# Patient Record
Sex: Female | Born: 1975 | Race: White | Hispanic: No | Marital: Married | Smoking: Former smoker
Health system: Southern US, Community
[De-identification: ages and names within clinical notes are randomized; demographics above are authoritative.]

## PROBLEM LIST (undated history)

## (undated) DIAGNOSIS — K219 Gastro-esophageal reflux disease without esophagitis: Secondary | ICD-10-CM

## (undated) DIAGNOSIS — F32A Depression, unspecified: Secondary | ICD-10-CM

## (undated) DIAGNOSIS — G43909 Migraine, unspecified, not intractable, without status migrainosus: Secondary | ICD-10-CM

## (undated) DIAGNOSIS — E785 Hyperlipidemia, unspecified: Secondary | ICD-10-CM

## (undated) DIAGNOSIS — F329 Major depressive disorder, single episode, unspecified: Secondary | ICD-10-CM

## (undated) HISTORY — DX: Morbid (severe) obesity due to excess calories: E66.01

## (undated) HISTORY — PX: OTHER SURGICAL HISTORY: SHX169

## (undated) HISTORY — DX: Hyperlipidemia, unspecified: E78.5

## (undated) HISTORY — DX: Depression, unspecified: F32.A

## (undated) HISTORY — DX: Major depressive disorder, single episode, unspecified: F32.9

## (undated) HISTORY — DX: Migraine, unspecified, not intractable, without status migrainosus: G43.909

---

## 1998-09-27 HISTORY — PX: LAPAROSCOPIC CHOLECYSTECTOMY: SUR755

## 1999-01-22 ENCOUNTER — Ambulatory Visit (HOSPITAL_COMMUNITY): Admission: RE | Admit: 1999-01-22 | Discharge: 1999-01-23 | Payer: Self-pay | Admitting: General Surgery

## 2000-10-25 ENCOUNTER — Emergency Department (HOSPITAL_COMMUNITY): Admission: EM | Admit: 2000-10-25 | Discharge: 2000-10-25 | Payer: Self-pay | Admitting: Emergency Medicine

## 2001-02-14 ENCOUNTER — Other Ambulatory Visit: Admission: RE | Admit: 2001-02-14 | Discharge: 2001-02-14 | Payer: Self-pay | Admitting: Obstetrics and Gynecology

## 2001-08-17 ENCOUNTER — Encounter: Admission: RE | Admit: 2001-08-17 | Discharge: 2001-08-17 | Payer: Self-pay | Admitting: *Deleted

## 2001-08-17 ENCOUNTER — Encounter: Payer: Self-pay | Admitting: *Deleted

## 2002-01-02 ENCOUNTER — Inpatient Hospital Stay (HOSPITAL_COMMUNITY): Admission: AD | Admit: 2002-01-02 | Discharge: 2002-01-04 | Payer: Self-pay | Admitting: Obstetrics and Gynecology

## 2002-02-08 ENCOUNTER — Other Ambulatory Visit: Admission: RE | Admit: 2002-02-08 | Discharge: 2002-02-08 | Payer: Self-pay | Admitting: Obstetrics and Gynecology

## 2003-07-29 ENCOUNTER — Other Ambulatory Visit: Admission: RE | Admit: 2003-07-29 | Discharge: 2003-07-29 | Payer: Self-pay | Admitting: Obstetrics and Gynecology

## 2006-01-26 ENCOUNTER — Encounter: Payer: Self-pay | Admitting: General Surgery

## 2007-04-06 ENCOUNTER — Inpatient Hospital Stay (HOSPITAL_COMMUNITY): Admission: AD | Admit: 2007-04-06 | Discharge: 2007-04-08 | Payer: Self-pay | Admitting: Obstetrics and Gynecology

## 2008-07-28 HISTORY — PX: TUBAL LIGATION: SHX77

## 2008-08-19 ENCOUNTER — Ambulatory Visit (HOSPITAL_COMMUNITY): Admission: RE | Admit: 2008-08-19 | Discharge: 2008-08-19 | Payer: Self-pay | Admitting: Obstetrics and Gynecology

## 2009-11-06 ENCOUNTER — Emergency Department (HOSPITAL_COMMUNITY): Admission: EM | Admit: 2009-11-06 | Discharge: 2009-11-06 | Payer: Self-pay | Admitting: Emergency Medicine

## 2011-02-09 NOTE — Op Note (Signed)
NAMEGAO, MITNICK            ACCOUNT NO.:  [id number]   MEDICAL RECORD NO.:  [id number]          PATIENT TYPE:  AMB   LOCATION:  SDC                           FACILITY:  [facility]   PHYSICIAN:  Lenoard Aden, M.D.DATE OF BIRTH:  Apr 11, 1976   DATE OF PROCEDURE:  08/19/2008  DATE OF DISCHARGE:                               OPERATIVE REPORT   PREOPERATIVE DIAGNOSES:  Desire for elective sterilization and  intrauterine device removal.   POSTOPERATIVE DIAGNOSES:  Desire for elective sterilization and  intrauterine device removal.   PROCEDURE:  Mirena IUD removal, laparoscopic tubal ligation.   SURGEON:  Lenoard Aden, MD   ANESTHESIA:  General.   ESTIMATED BLOOD LOSS:  Less than 50 mL.   COMPLICATIONS:  None.   DRAINS AND COUNTS:  Correct.   The patient to recovery in good condition.   BRIEF OPERATIVE NOTE:  After being apprised of risks of anesthesia,  infection, bleeding, injury to abdominal organs, need for repair,  delayed versus immediate complications to include bowel and bladder  injury, the patient was brought to the operating room where she was  administered general anesthetic without complications, placed in dorsal  lithotomy position, feet on Yellofin stirrups, prepped and draped in  usual sterile fashion, catheterized until the bladder was empty.  Hulka  tenaculum placed per vagina after IUD was removed using a Kelly clamp.  The string was grasped, removed intact without difficulty.  Minimal  bleeding noted.  Hulka tenaculum placed per vagina.  Attention turned  abdominally whereby an infraumbilical incision made with scalpel.  Veress needle placed, opening pressure -2 noted, 4 L of CO2 insufflated  without difficulty.  Trocar placed atraumatically.  Visualization  reveals normal atraumatic trocar entry.  Normal liver gallbladder bed,  normal uterus, anterior posterior cul-de-sac, normal tubes, normal  ovaries.  Right tube was traced out to fimbriated end,  ampullary isthmic  portion of the tube was identified and cauterized with bipolar  coagulation in 3 contiguous segments.  Same procedure which was done on  the right tube was done on the left tube.  Both lumens were visualized  after division using hook scissors.  Good hemostasis noted.  CO2 was  released and revisualization with insufflation reveals good hemostasis.  All instruments  were removed under direct visualization.  CO2 was completely released.  Positive pressure applied.  Good hemostasis was noted.  Incision was  closed using 0-Vicryl and Dermabond.  All instruments removed from the  vagina.  The patient tolerated the procedure well, went to recovery in  good condition.      Lenoard Aden, M.D.  Electronically Signed     RJT/MEDQ  D:  08/19/2008  T:  08/20/2008  Job:  [id number]

## 2011-02-09 NOTE — Op Note (Signed)
Sheila Rodriguez, Sheila Rodriguez            ACCOUNT NO.:  [id number]   MEDICAL RECORD NO.:  [id number]          PATIENT TYPE:  INP   LOCATION:  9164                          FACILITY:  [facility]   PHYSICIAN:  [place] B. Earlene Plater, M.D.  DATE OF BIRTH:  16-Jun-1976   DATE OF PROCEDURE:  04/06/2007  DATE OF DISCHARGE:                               OPERATIVE REPORT   PREOPERATIVE DIAGNOSIS:  1. Term intrauterine pregnancy.  2. Fetal bradycardia.   POSTOPERATIVE DIAGNOSIS:  1. Term intrauterine pregnancy.  2. Fetal bradycardia.   PROCEDURE:  Vacuum assisted vaginal delivery.   INDICATIONS FOR PROCEDURE:  The patient was in labor and noted to have  an approximately 8 minute fetal bradycardia which intermittently  improved with position changes, oxygen, fluid bolus, discontinuation of  pitocin and Ephedrine, however, ultimately returned back to the 80s and  was as low as the 60s intermittently.  The patient was re-examined and  found to be complete after having been 8-9 cm only moments before.  She  was subsequently complete/complete and +2, direct OA position with fetal  heart rate rising back up into the normal range.  I recommended vacuum  delivery to expedite the process.   The bladder was being emptied with a Foley catheter which was removed  just prior to delivery.  Exam under anesthesia confirmed she was  complete/complete, +2, direct OA, with minimal molding or caput.  The  Mityvac mushroom cup was placed on the flexion point.  From the high  green zone, two pulls necessary, one pop-off encountered.  Double nuchal  cord noted, easily reduced.  Turtle sign encountered.  McRoberts  position assumed and suprapubic pressure applied without relief x1.  Subsequently, the posterior arm was palpated, did not feel amenable to  delivery.  Therefore, the shoulders were rotated of about 10 degrees of  the right shoulder to the patient's right.  This, in combination with  the above maneuvers, relieved the  dystocia.  This lasted about 20  seconds all together.  The remainder of the infant delivered without  difficulty.  Cord clamped and cut.  The infant handed off to the  awaiting NICU team.  The baby was immediately vigorous.  Cord pH  obtained.  Apgars were 9 and 9.  Cord pH was 7.24.   The perineum was inspected.  A second degree laceration noted, closed in  two layers with 3-0 chromic.  The placenta was removed with uterine  massage.  There was mild uterine atony, no retained products on exam.  Pitocin, uterine massage, and Hemabate x1 resolved the issue.  Blood  loss about 600 mL.  The patient and fetus tolerated the procedure well,  no complications.  They were both in the delivery room in good condition  thereafter.     Gerri Spore B. Earlene Plater, M.D.  Electronically Signed    WBD/MEDQ  D:  04/06/2007  T:  04/07/2007  Job:  [id number]

## 2011-02-09 NOTE — H&P (Signed)
NAMEJULIANNE, Sheila Rodriguez            ACCOUNT NO.:  [id number]   MEDICAL RECORD NO.:  [id number]          PATIENT TYPE:  AMB   LOCATION:  SDC                           [facility]ILITY:  [facility]   PHYSICIAN:  Lenoard Aden, M.D.DATE OF BIRTH:  07/21/1976   DATE OF ADMISSION:  08/19/2008  DATE OF DISCHARGE:                              HISTORY & PHYSICAL   CHIEF COMPLAINT:  Desire for elective sterilization.   A 35 year old white female, G2, P2 who presents for desire for elective  sterilization.  She is a nonsmoker, nondrinker.  She denies domestic or  physical violence.  She has a history of Mirena IUD use at this time  which was discontinued and we will proceed with permanent sterilization.  SHE HAS NO KNOWN DRUG ALLERGIES.   FAMILY HISTORY:  Breast cancer, migraine headaches, and diabetes.   SOCIAL HISTORY:  Noncontributory.   PREGNANCY HISTORY:  Two uncomplicated vaginal deliveries.   PHYSICAL EXAM:  She is a well-developed, well-nourished, white female in  no acute distress.  HEENT: Normal.  LUNGS: Clear.  HEART:  Regular rhythm.  ABDOMEN:  Soft, nontender.  PELVIC EXAM:  Uterus normal size, shape, and contour.  No adnexal  masses.   IMPRESSION:  Desire for elective sterilization.   PLAN:  Proceed with laparoscopic tubal ligation.  Risks of anesthesia,  infection, bleeding, injury to abdominal organs, need for repair was  discussed.  Delayed versus immediate complications to include bowel and  bladder injury, noted failure risk of tubal ligation 5 to 10 per 1000  noted.  Patient acknowledges and wishes to proceed.      Lenoard Aden, M.D.  Electronically Signed     RJT/MEDQ  D:  08/19/2008  T:  08/19/2008  Job:  [id number]

## 2011-06-29 LAB — CBC
HCT: 36.9
Hemoglobin: 12.5
MCHC: 34
MCV: 84.4
Platelets: 290
RBC: 4.37
RDW: 13.4
WBC: 9.4

## 2011-06-29 LAB — HCG, SERUM, QUALITATIVE: Preg, Serum: NEGATIVE

## 2011-07-13 LAB — CBC
HCT: 32.8 — ABNORMAL LOW
HCT: 37.8
Hemoglobin: 11.3 — ABNORMAL LOW
Hemoglobin: 12.9
MCHC: 34.2
MCHC: 34.6
MCV: 87.1
MCV: 87.5
Platelets: 254
RBC: 3.74 — ABNORMAL LOW
RBC: 4.35
RDW: 14.9 — ABNORMAL HIGH
RDW: 15.3 — ABNORMAL HIGH
WBC: 11.9 — ABNORMAL HIGH

## 2011-07-13 LAB — RAPID HIV SCREEN (WH-MAU): Rapid HIV Screen: NONREACTIVE

## 2012-06-30 ENCOUNTER — Ambulatory Visit (INDEPENDENT_AMBULATORY_CARE_PROVIDER_SITE_OTHER): Payer: BC Managed Care – PPO | Admitting: General Surgery

## 2012-07-19 ENCOUNTER — Encounter (INDEPENDENT_AMBULATORY_CARE_PROVIDER_SITE_OTHER): Payer: Self-pay | Admitting: General Surgery

## 2012-07-19 ENCOUNTER — Ambulatory Visit (INDEPENDENT_AMBULATORY_CARE_PROVIDER_SITE_OTHER): Payer: BC Managed Care – PPO | Admitting: General Surgery

## 2012-07-19 VITALS — BP 122/84 | HR 84 | Temp 98.0°F | Resp 16 | Ht 66.25 in | Wt 271.0 lb

## 2012-07-19 DIAGNOSIS — E66813 Obesity, class 3: Secondary | ICD-10-CM

## 2012-07-19 DIAGNOSIS — Z6841 Body Mass Index (BMI) 40.0 and over, adult: Secondary | ICD-10-CM

## 2012-07-19 NOTE — Progress Notes (Signed)
Patient ID: Sheila Rodriguez, female   DOB: 03/26/1976, 36 y.o.   MRN: [id number]  Chief Complaint  Patient presents with  . Weight Loss Surgery    lap band    HPI Sheila Rodriguez is a 36 y.o. female.   HPI 36 yo WF referred by Dr Billy Coast for consideration of weight loss surgery.  The patient was initially  Interested in laparoscopic adjustable gastric band surgery but is now leaning toward laparoscopic Roux-en-Y gastric bypass.  The patient states that she has struggled with her weight most of her adulthood. Despite numerous attempts for sustained weight loss, the patient has been unsuccessful. She has tried Weight Watchers at least on 3 separate occasions, curves, Meridia, Alli, protein shakes, and a 12 week boot camp all without any long-term success. She has also seen Noelle Redmon, PA-C, for supervised weight loss visits in the past.   Past Medical History  Diagnosis Date  . Depression   . Migraines     Past Surgical History  Procedure Date  . Laparoscopic cholecystectomy 2000  . Tubal ligation 07/2008    Family History  Problem Relation Age of Onset  . Breast cancer Mother 76  . Colon cancer Paternal Aunt   . Bladder Cancer Paternal Aunt   . Kidney cancer Maternal Uncle     Social History History  Substance Use Topics  . Smoking status: Former Smoker    Quit date: 12/27/2003  . Smokeless tobacco: Not on file  . Alcohol Use: Yes     once a month    No Known Allergies  Current Outpatient Prescriptions  Medication Sig Dispense Refill  . buPROPion (WELLBUTRIN XL) 300 MG 24 hr tablet       . FLUoxetine (PROZAC) 20 MG capsule       . ibuprofen (ADVIL,MOTRIN) 400 MG tablet Take 400 mg by mouth every 6 (six) hours as needed.      . ranitidine (ZANTAC) 75 MG tablet Take 75 mg by mouth 2 (two) times daily.        Review of Systems Review of Systems  Constitutional: Negative for fever, chills and unexpected weight change.  HENT: Negative for hearing loss,  congestion, sore throat, trouble swallowing and voice change.   Eyes: Negative for visual disturbance.  Respiratory: Negative for cough and wheezing.   Cardiovascular: Negative for chest pain, palpitations and leg swelling.  Gastrointestinal: Negative for nausea, vomiting, abdominal pain, blood in stool, abdominal distention and anal bleeding.       Some heartburn and occasional indigestion - resolves with zantac which takes prn; some occasional bouts of d/c but infrequent  Genitourinary: Negative for hematuria, vaginal bleeding and difficulty urinating.       Some urinary incontinence with coughing, sneezing; G3P3; has had a mammogram last year b/c of family history of cancer - normal. Getting mammo on 11/1  Musculoskeletal: Negative for arthralgias.  Skin: Negative for rash and wound.  Neurological: Negative for dizziness, seizures, syncope and headaches.  Hematological: Negative for adenopathy. Does not bruise/bleed easily.  Psychiatric/Behavioral: Negative for confusion.       Depression, sees [facility] - Dr Orion Modest (sp?)    Blood pressure 122/84, pulse 84, temperature 98 F (36.7 C), temperature source Temporal, resp. rate 16, height 5' 6.25" (1.683 m), weight 271 lb (122.925 kg).  Physical Exam Physical Exam  Vitals reviewed. Constitutional: She is oriented to person, place, and time. She appears well-developed and well-nourished. No distress.  HENT:  Head: Normocephalic and  atraumatic.  Right Ear: External ear normal.  Left Ear: External ear normal.  Nose: Nose normal.  Eyes: Conjunctivae normal and EOM are normal. No scleral icterus.  Neck: Normal range of motion. Neck supple. No tracheal deviation present. No thyromegaly present.  Cardiovascular: Normal rate, regular rhythm, normal heart sounds and intact distal pulses.   Pulmonary/Chest: Effort normal and breath sounds normal. No stridor. No respiratory distress. She has no wheezes. She has no rales.  Abdominal:  Soft. Bowel sounds are normal. She exhibits no distension. There is no tenderness. There is no rebound and no guarding.       Well healed trocar sites  Musculoskeletal: Normal range of motion. She exhibits no edema and no tenderness.  Lymphadenopathy:    She has no cervical adenopathy.  Neurological: She is alert and oriented to person, place, and time. She exhibits normal muscle tone.  Skin: Skin is warm and dry. No rash noted. She is not diaphoretic. No erythema. No pallor.  Psychiatric: She has a normal mood and affect. Her behavior is normal. Judgment and thought content normal.    Data Reviewed Letter of medical necessity Pt's personal letter  Assessment    Morbid obesity BMI 44 Elevated lipids Probable GERD    Plan    The patient meets weight loss surgery criteria. I think the patient would be an acceptable candidate for Laparoscopic Roux-en-Y Gastric bypass and LAGB. Currently, the patient is undecided as to which procedure but is leaning toward bypass.  We discussed laparoscopic Roux-en-Y gastric bypass. We discussed the preoperative, operative and postoperative process. Using diagrams, I explained the surgery in detail including the performance of an EGD near the end of the surgery and an Upper GI swallow study on POD 1. We discussed the typical hospital course including a 2-3 day stay baring any complications.   The patient was given educational material. I quoted the patient that they can expect to lose 50-70% of their excess weight with the gastric bypass. We did discuss the possibility of weight regain several years after the procedure.  We discussed the risk and benefits of surgery including but not limited to anesthesia risk, bleeding, infection, anastomotic edema requiring a few additional days in the hospital, postop nausea, blood clot formation, anastomotic leak, anastomotic stricture, ulcer formation, death, respiratory complications, intestinal blockage, internal  hernia, gallstone formation, vitamin and nutritional deficiencies, injury to surrounding structures, failure to lose weight and mood changes.  We discussed that before and after surgery that there would be an alteration in their diet. I explained that we have put them on a diet 2 weeks before surgery. I also explained that they would be on a liquid diet for 2 weeks after surgery. We discussed that they would have to avoid certain foods such as sugar after surgery. We discussed the importance of physical activity as well as compliance with our dietary and supplement recommendations and routine follow-up.  We discussed laparoscopic adjustable gastric banding. The patient was given Agricultural engineer. We discussed the risk and benefits of surgery including but not limited to bleeding, infection, injury to surrounding structures, blood clot formation such as deep venous thrombosis or pulmonary embolism, need to convert to an open procedure, band slippage, band erosion, failure to loose weight, port complications (leak or flippage), potential need for reoperative surgery, esophageal dilatation, worsening reflux, and vitamin deficiencies. We discussed the typical post operative recovery course. We discussed that their postoperative diet will be modified for several weeks. We specifically talked about the  need to be on a liquid diet for one to 2 weeks after surgery. We also discussed the typical postoperative course with a laparoscopic adjustable gastric band and the need for frequent postoperative visits to assess the volume status of the band.  We discussed the typical expected weight loss with a laparoscopic adjustable gastric band. I explained to the patient that they can expect to lose 40-60% of their excess body weight if they are compliant with their postoperative instructions. However I did explain that some patients loose less than 40% and some patients lose more than 60% of their excess body weight.  I  explained that the likelihood of improvement in their obesity is good.  I explained to the patient that we will start our evaluation process which includes labs, Upper GI to evaluate stomach and swallowing anatomy, nutritionist consultation, psychiatrist consultation, EKG, CXR.   She was instructed to call with any additional questions  Mary Sella. Andrey Campanile, MD, FACS General, Bariatric, & Minimally Invasive Surgery [facility] Surgery, [place]              [facility] M 07/19/2012, 12:23 PM

## 2012-07-19 NOTE — Patient Instructions (Signed)
Think about what we discussed. We will start the evaluation process. Please call me if you have any additional questions.

## 2012-07-26 ENCOUNTER — Ambulatory Visit (HOSPITAL_COMMUNITY)
Admission: RE | Admit: 2012-07-26 | Discharge: 2012-07-26 | Disposition: A | Discharge status: Home / Self Care | Payer: BC Managed Care – PPO | Source: Ambulatory Visit | Attending: General Surgery | Admitting: General Surgery

## 2012-07-26 ENCOUNTER — Other Ambulatory Visit: Payer: Self-pay

## 2012-07-26 DIAGNOSIS — Z6841 Body Mass Index (BMI) 40.0 and over, adult: Secondary | ICD-10-CM

## 2012-07-26 DIAGNOSIS — K219 Gastro-esophageal reflux disease without esophagitis: Secondary | ICD-10-CM | POA: Insufficient documentation

## 2012-07-26 DIAGNOSIS — E789 Disorder of lipoprotein metabolism, unspecified: Secondary | ICD-10-CM | POA: Insufficient documentation

## 2012-08-01 LAB — COMPREHENSIVE METABOLIC PANEL
ALT: 12 U/L (ref 0–35)
Alkaline Phosphatase: 71 U/L (ref 39–117)
CO2: 26 mEq/L (ref 19–32)
Creat: 0.77 mg/dL (ref 0.50–1.10)
Glucose, Bld: 72 mg/dL (ref 70–99)
Sodium: 140 mEq/L (ref 135–145)
Total Bilirubin: 0.4 mg/dL (ref 0.3–1.2)
Total Protein: 6.9 g/dL (ref 6.0–8.3)

## 2012-08-01 LAB — CBC WITH DIFFERENTIAL/PLATELET
Basophils Relative: 0 % (ref 0–1)
Eosinophils Absolute: 0.3 10*3/uL (ref 0.0–0.7)
Eosinophils Relative: 3 % (ref 0–5)
MCH: 26.4 pg (ref 26.0–34.0)
MCHC: 32.7 g/dL (ref 30.0–36.0)
MCV: 80.7 fL (ref 78.0–100.0)
Neutrophils Relative %: 60 % (ref 43–77)
Platelets: 342 10*3/uL (ref 150–400)

## 2012-08-01 LAB — TSH: TSH: 2.252 u[IU]/mL (ref 0.350–4.500)

## 2012-08-01 LAB — LIPID PANEL
Cholesterol: 166 mg/dL (ref 0–200)
HDL: 48 mg/dL (ref >39)
Total CHOL/HDL Ratio: 3.5 Ratio
VLDL: 36 mg/dL (ref 0–40)

## 2012-08-02 LAB — H. PYLORI ANTIBODY, IGG: H Pylori IgG: <0.4 {ISR}

## 2012-08-09 ENCOUNTER — Encounter: Payer: Self-pay | Admitting: *Deleted

## 2012-08-09 ENCOUNTER — Encounter: Payer: BC Managed Care – PPO | Attending: General Surgery | Admitting: *Deleted

## 2012-08-09 VITALS — Ht 66.25 in | Wt 269.9 lb

## 2012-08-09 DIAGNOSIS — Z01818 Encounter for other preprocedural examination: Secondary | ICD-10-CM | POA: Insufficient documentation

## 2012-08-09 DIAGNOSIS — Z713 Dietary counseling and surveillance: Secondary | ICD-10-CM | POA: Insufficient documentation

## 2012-08-09 NOTE — Patient Instructions (Signed)
   Follow Pre-Op Nutrition Goals to prepare for Lapband or Gastric Bypass Surgery.   Call the [facility] at [phone] once you have been given your surgery date to enrolled in the Pre-Op Nutrition Class. You will need to attend this nutrition class 3-4 weeks prior to your surgery.  

## 2012-08-09 NOTE — Progress Notes (Signed)
  Pre-Op Assessment Visit:  Pre-Operative LAGB Surgery  Medical Nutrition Therapy:  Appt start time: 0930   End time:  1030.  Patient was seen on 08/09/2012 for Pre-Operative Bariatric Surgery Nutrition Assessment. Assessment and letter of approval faxed to [facility] Bariatric Surgery Program coordinator on 08/09/2012.  Approval letter sent to [facility] center and will be available in the chart under the media tab.  TANITA  BODY COMP RESULTS  08/09/12   %Fat 53.6%   Fat Mass (lbs) 144.0   Fat Free Mass (lbs) 125.0   Total Body Water (lbs) 91.5   Handouts given during visit include:  Pre-Op Goals   Bariatric Surgery Protein Shakes  Patient to call for Pre-Op and Post-Op Nutrition Education at the [facility] when surgery is scheduled.

## 2012-09-06 ENCOUNTER — Other Ambulatory Visit (INDEPENDENT_AMBULATORY_CARE_PROVIDER_SITE_OTHER): Payer: Self-pay | Admitting: General Surgery

## 2012-09-28 ENCOUNTER — Encounter: Payer: BC Managed Care – PPO | Attending: General Surgery | Admitting: *Deleted

## 2012-09-28 VITALS — Ht 66.25 in | Wt 269.7 lb

## 2012-09-28 DIAGNOSIS — Z01818 Encounter for other preprocedural examination: Secondary | ICD-10-CM | POA: Insufficient documentation

## 2012-09-28 DIAGNOSIS — Z713 Dietary counseling and surveillance: Secondary | ICD-10-CM | POA: Insufficient documentation

## 2012-09-29 ENCOUNTER — Encounter: Payer: Self-pay | Admitting: *Deleted

## 2012-09-29 NOTE — Patient Instructions (Signed)
Follow:   Pre-Op Diet per MD 2 weeks prior to surgery  Phase 2- Liquids (clear/full) 2 weeks after surgery  Vitamin/Mineral/Calcium guidelines for purchasing bariatric supplements  Exercise guidelines pre and post-op per MD  Follow-up at [facility] in 2 weeks post-op for diet advancement. Contact Vasiliy Mccarry as needed with questions/concerns. 

## 2012-09-29 NOTE — Progress Notes (Signed)
Bariatric Class:  Appt start time: 1730 end time:  1830.  Pre-Operative Nutrition Class  Patient was seen on 09/28/12 for Pre-Operative Bariatric Surgery Education at the [facility].   Surgery date: 10/16/12 Surgery type: RYGB Start weight at [facility]: 269.9 lbs (08/09/12) Goal weight: 150 lbs  Weight today: 269.7 lbs  TANITA BODY COMP RESULTS   08/09/12    BMI (kg/m^2)   43.2   Fat Mass (lbs)  144.0    Fat Free Mass (lbs)  125.0    Total Body Water (lbs)  91.5    Samples given per MNT protocol: Bariatric Advantage Multivitamin Lot # [id number]; Exp: 06/15  Celebrate Vitamins Multivitamin Lot # [id number]; Exp: 07/14  Celebrate Vitamins Calcium Citrate Lot # [id number]; Exp: 03/15  Opurity Sublingual B12 Lot # [id number]; Exp: 09/15  Opurity Bypass/Sleeve-Optimized MVI Lot # [id number]; Exp: 06/14  Unjury Protein powder Lot # [id number]; Exp: 02/15  Premier Protein shake Lot # [id number]; Exp: 08/04/13  The following the learning objective met by the patient during this course:  Identifies Pre-Op Dietary Goals and will begin 2 weeks pre-operatively  Identifies appropriate sources of fluids and proteins   States protein recommendations and appropriate sources pre and post-operatively  Identifies Post-Operative Dietary Goals and will follow for 2 weeks post-operatively  Identifies appropriate multivitamin and calcium sources  Describes the need for physical activity post-operatively and will follow MD recommendations  States when to call healthcare provider regarding medication questions or post-operative complications  Handouts given during class include:  Pre-Op Bariatric Surgery Diet Handout  Protein Shake Handout  Post-Op Bariatric Surgery Nutrition Handout  BELT Program Information Flyer  Support Group Information Flyer  [facility] Bariatric Supplements Price List  Follow-Up Plan: Patient will follow-up at [facility] 2 weeks post operatively  for diet advancement per MD.

## 2012-10-05 ENCOUNTER — Encounter (HOSPITAL_COMMUNITY): Payer: Self-pay | Admitting: Pharmacy Technician

## 2012-10-11 ENCOUNTER — Encounter (HOSPITAL_COMMUNITY)
Admission: RE | Admit: 2012-10-11 | Discharge: 2012-10-11 | Disposition: A | Discharge status: Home / Self Care | Payer: BC Managed Care – PPO | Source: Ambulatory Visit | Attending: General Surgery | Admitting: General Surgery

## 2012-10-11 ENCOUNTER — Ambulatory Visit (INDEPENDENT_AMBULATORY_CARE_PROVIDER_SITE_OTHER): Payer: BC Managed Care – PPO | Admitting: General Surgery

## 2012-10-11 ENCOUNTER — Encounter (INDEPENDENT_AMBULATORY_CARE_PROVIDER_SITE_OTHER): Payer: Self-pay | Admitting: General Surgery

## 2012-10-11 ENCOUNTER — Encounter (HOSPITAL_COMMUNITY): Payer: Self-pay

## 2012-10-11 VITALS — BP 146/88 | HR 84 | Temp 97.7°F | Resp 20 | Ht 66.0 in | Wt 264.0 lb

## 2012-10-11 DIAGNOSIS — Z6841 Body Mass Index (BMI) 40.0 and over, adult: Secondary | ICD-10-CM

## 2012-10-11 HISTORY — DX: Gastro-esophageal reflux disease without esophagitis: K21.9

## 2012-10-11 LAB — CBC WITH DIFFERENTIAL/PLATELET
Eosinophils Absolute: 0.2 10*3/uL (ref 0.0–0.7)
Eosinophils Relative: 3 % (ref 0–5)
HCT: 40.8 % (ref 36.0–46.0)
Lymphocytes Relative: 28 % (ref 12–46)
Lymphs Abs: 2.2 10*3/uL (ref 0.7–4.0)
MCH: 27 pg (ref 26.0–34.0)
MCV: 82.9 fL (ref 78.0–100.0)
Monocytes Absolute: 0.6 10*3/uL (ref 0.1–1.0)
Monocytes Relative: 7 % (ref 3–12)
RBC: 4.92 MIL/uL (ref 3.87–5.11)
WBC: 7.7 10*3/uL (ref 4.0–10.5)

## 2012-10-11 LAB — COMPREHENSIVE METABOLIC PANEL
ALT: 25 U/L (ref 0–35)
BUN: 16 mg/dL (ref 6–23)
CO2: 29 mEq/L (ref 19–32)
Calcium: 9.5 mg/dL (ref 8.4–10.5)
Creatinine, Ser: 0.84 mg/dL (ref 0.50–1.10)
GFR calc Af Amer: >90 mL/min (ref >90)
GFR calc non Af Amer: 88 mL/min — ABNORMAL LOW (ref >90)
Glucose, Bld: 81 mg/dL (ref 70–99)
Sodium: 140 mEq/L (ref 135–145)
Total Protein: 7.4 g/dL (ref 6.0–8.3)

## 2012-10-11 MED ORDER — PEG-KCL-NACL-NASULF-NA ASC-C 100 G PO SOLR: 1.0000 | Freq: Once | ORAL | Status: DC

## 2012-10-11 NOTE — Progress Notes (Signed)
Patient ID: Sheila Rodriguez, female   DOB: June 01, 1976, 37 y.o.   MRN: [id number]  Chief Complaint  Patient presents with  . Bariatric Pre-op    HPI Sheila Rodriguez is a 37 y.o. female.   HPI 37 year old morbidly obese Caucasian female comes in today for her preoperative appointment. She is currently scheduled to undergo laparoscopic Roux-en-Y gastric bypass next week. She was initially seen in the office in October 2013. At that time she weighed 271 pounds. Today she weighs 264 pounds. She has already begun on her preoperative diet. She denies any significant changes to her medical history since she was last seen. She denies any surgeries or medical diagnoses. She denies any trips to the emergency room. She states that she has become a little bit constipated since starting the preoperative protein shakes. Past Medical History  Diagnosis Date  . Depression   . Migraines   . Morbid obesity   . Hyperlipidemia     TG - 178 (07/2012) per pt  . GERD (gastroesophageal reflux disease)     Past Surgical History  Procedure Date  . Laparoscopic cholecystectomy 2000  . Tubal ligation 07/2008    Family History  Problem Relation Age of Onset  . Breast cancer Mother 35  . Colon cancer Paternal Aunt   . Bladder Cancer Paternal Aunt   . Kidney cancer Maternal Uncle     Social History History  Substance Use Topics  . Smoking status: Former Smoker    Quit date: 12/27/2003  . Smokeless tobacco: Never Used  . Alcohol Use: Yes     Comment: once a month    No Known Allergies  Current Outpatient Prescriptions  Medication Sig Dispense Refill  . acetaminophen (TYLENOL) 500 MG tablet Take 500 mg by mouth every 6 (six) hours as needed.      Marland Kitchen buPROPion (WELLBUTRIN XL) 300 MG 24 hr tablet Take 300 mg by mouth every morning.       Marland Kitchen FLUoxetine (PROZAC) 20 MG capsule Take 20 mg by mouth daily.       Marland Kitchen ibuprofen (ADVIL,MOTRIN) 400 MG tablet Take 400 mg by mouth every 6 (six) hours as needed.  For pain      . ranitidine (ZANTAC) 75 MG tablet Take 75 mg by mouth 2 (two) times daily.      . peg 3350 powder (MOVIPREP) 100 G SOLR Take 1 kit (100 g total) by mouth once.  1 kit  0    Review of Systems Review of Systems  Constitutional: Negative for fever, chills and unexpected weight change.  HENT: Negative for hearing loss, congestion, sore throat, trouble swallowing and voice change.   Eyes: Negative for visual disturbance.  Respiratory: Negative for cough and wheezing.   Cardiovascular: Negative for chest pain, palpitations and leg swelling.  Gastrointestinal: Positive for constipation. Negative for nausea, vomiting, abdominal pain, diarrhea, blood in stool, abdominal distention and anal bleeding.  Genitourinary: Negative for hematuria, vaginal bleeding and difficulty urinating.  Musculoskeletal: Negative for arthralgias.  Skin: Negative for rash and wound.  Neurological: Negative for seizures, syncope and headaches.  Hematological: Negative for adenopathy. Does not bruise/bleed easily.  Psychiatric/Behavioral: Negative for confusion.    Blood pressure 146/88, pulse 84, temperature 97.7 F (36.5 C), temperature source Temporal, resp. rate 20, height 5\' 6"  (1.676 m), weight 264 lb (119.75 kg), last menstrual period 10/05/2012.  Physical Exam Physical Exam  Vitals reviewed. Constitutional: She is oriented to person, place, and time. She appears well-developed and well-nourished.  No distress.       Morbid obesity  HENT:  Head: Normocephalic and atraumatic.  Right Ear: External ear normal.  Left Ear: External ear normal.  Eyes: Conjunctivae normal are normal. No scleral icterus.  Neck: Normal range of motion. Neck supple. No tracheal deviation present. No thyromegaly present.  Cardiovascular: Normal rate, regular rhythm and normal heart sounds.   Pulmonary/Chest: Effort normal and breath sounds normal. No respiratory distress. She has no wheezes.  Abdominal: Soft. She  exhibits no distension. There is no tenderness. There is no rebound.       Well healed trocar sites  Musculoskeletal: She exhibits no edema and no tenderness.  Neurological: She is alert and oriented to person, place, and time. She exhibits normal muscle tone.  Skin: Skin is warm and dry. No rash noted. She is not diaphoretic. No erythema. No pallor.  Psychiatric: She has a normal mood and affect. Her behavior is normal. Judgment and thought content normal.    Data Reviewed My office note from 06/2012 ugi - reflux Wt loss evaluation labs - TG 178 o/w normal  Assessment    Morbid obesity BMI 42.6 GERD Elevated triglycerides    Plan    I congratulated her on her preoperative weight loss today. She has lost 7 pounds since I initially saw her. I encouraged her to continue with her preoperative diet. With respect to the constipation we talked about milk of magnesia or MiraLAX. He also discussed the importance of routine exercise. We discussed potentially getting a pedometer and tracking her steps. All of her questions were asked and answered. We reviewed the typical postoperative course. She was given a prescription for her bowel prep. I look forward to working with her.  Mary Sella. Andrey Campanile, MD, FACS General, Bariatric, & Minimally Invasive Surgery [facility], [place]        [facility] M 10/11/2012, 11:42 AM

## 2012-10-11 NOTE — Patient Instructions (Signed)
Get your bowel prep and let me know if there is a problem with getting it Keep up with the preop diet - you're doing great

## 2012-10-11 NOTE — Patient Instructions (Addendum)
Sheila Rodriguez  10/11/2012                           YOUR PROCEDURE IS SCHEDULED ON: 10/16/12               PLEASE REPORT TO SHORT STAY CENTER AT : 5:15 AM               CALL THIS NUMBER IF ANY PROBLEMS THE DAY OF SURGERY :               [phone]                      REMEMBER:   Do not eat food or drink liquids AFTER MIDNIGHT   Take these medicines the morning of surgery with A SIP OF WATER:  WELLBUTRIN / PROZAC / ZANTAC   Do not wear jewelry, make-up   Do not wear lotions, powders, or perfumes.   Do not shave legs or underarms 12 hrs. before surgery (men may shave face)  Do not bring valuables to the hospital.  Contacts, dentures or bridgework may not be worn into surgery.  Leave suitcase in the car. After surgery it may be brought to your room.  For patients admitted to the hospital more than one night, checkout time is 11:00                          The day of discharge.   Patients discharged the day of surgery will not be allowed to drive home                             If going home same day of surgery, must have someone stay with you first                           24 hrs at home and arrange for some one to drive you home from hospital.    Special Instructions:   Please read over the following fact sheets that you were given:               1. MRSA  INFORMATION                      2. [place] PREPARING FOR SURGERY SHEET               3. STOP HERBAL MEDICATIIONS AND ASPIRIN PRODUCTS AND IBUPROFEN / ADVIL / ALEVE  / MOTRIN 7 DAYS PREOP                                                X_____________________________________________________________________        Failure to follow these instructions may result in cancellation of your surgery

## 2012-10-16 ENCOUNTER — Inpatient Hospital Stay (HOSPITAL_COMMUNITY)
Admission: RE | Admit: 2012-10-16 | Discharge: 2012-10-18 | DRG: 288 | Disposition: A | Discharge status: Home / Self Care | Payer: BC Managed Care – PPO | Source: Ambulatory Visit | Attending: General Surgery | Admitting: General Surgery

## 2012-10-16 ENCOUNTER — Ambulatory Visit (HOSPITAL_COMMUNITY): Payer: BC Managed Care – PPO | Admitting: Anesthesiology

## 2012-10-16 ENCOUNTER — Encounter (HOSPITAL_COMMUNITY): Payer: Self-pay | Admitting: Anesthesiology

## 2012-10-16 ENCOUNTER — Encounter (HOSPITAL_COMMUNITY): Payer: Self-pay | Admitting: *Deleted

## 2012-10-16 ENCOUNTER — Encounter (HOSPITAL_COMMUNITY)
Admission: RE | Disposition: A | Discharge status: Home / Self Care | Payer: Self-pay | Source: Ambulatory Visit | Attending: General Surgery

## 2012-10-16 DIAGNOSIS — E785 Hyperlipidemia, unspecified: Secondary | ICD-10-CM | POA: Diagnosis present

## 2012-10-16 DIAGNOSIS — K5900 Constipation, unspecified: Secondary | ICD-10-CM | POA: Diagnosis present

## 2012-10-16 DIAGNOSIS — Z79899 Other long term (current) drug therapy: Secondary | ICD-10-CM

## 2012-10-16 DIAGNOSIS — Z6841 Body Mass Index (BMI) 40.0 and over, adult: Secondary | ICD-10-CM

## 2012-10-16 DIAGNOSIS — F3289 Other specified depressive episodes: Secondary | ICD-10-CM | POA: Diagnosis present

## 2012-10-16 DIAGNOSIS — Z87891 Personal history of nicotine dependence: Secondary | ICD-10-CM

## 2012-10-16 DIAGNOSIS — F329 Major depressive disorder, single episode, unspecified: Secondary | ICD-10-CM | POA: Diagnosis present

## 2012-10-16 DIAGNOSIS — K210 Gastro-esophageal reflux disease with esophagitis: Secondary | ICD-10-CM

## 2012-10-16 DIAGNOSIS — Z9089 Acquired absence of other organs: Secondary | ICD-10-CM

## 2012-10-16 DIAGNOSIS — E781 Pure hyperglyceridemia: Secondary | ICD-10-CM

## 2012-10-16 DIAGNOSIS — K219 Gastro-esophageal reflux disease without esophagitis: Secondary | ICD-10-CM | POA: Diagnosis present

## 2012-10-16 HISTORY — PX: UPPER GI ENDOSCOPY: SHX6162

## 2012-10-16 HISTORY — PX: GASTRIC ROUX-EN-Y: SHX5262

## 2012-10-16 LAB — HEMOGLOBIN AND HEMATOCRIT, BLOOD: HCT: 39.4 % (ref 36.0–46.0)

## 2012-10-16 SURGERY — LAPAROSCOPIC ROUX-EN-Y GASTRIC
Anesthesia: General | Site: Abdomen

## 2012-10-16 MED ORDER — KCL IN DEXTROSE-NACL 20-5-0.45 MEQ/L-%-% IV SOLN
INTRAVENOUS | Status: DC
  Administered 2012-10-16 – 2012-10-18 (×6): via INTRAVENOUS
  Filled 2012-10-16 (×8): qty 1000

## 2012-10-16 MED ORDER — HEPARIN SODIUM (PORCINE) 5000 UNIT/ML IJ SOLN
5000.0000 [IU] | INTRAMUSCULAR | Status: AC
  Administered 2012-10-16: 5000 [IU] via SUBCUTANEOUS
  Filled 2012-10-16: qty 1

## 2012-10-16 MED ORDER — ACETAMINOPHEN 10 MG/ML IV SOLN
INTRAVENOUS | Status: AC
  Filled 2012-10-16: qty 100

## 2012-10-16 MED ORDER — NEOSTIGMINE METHYLSULFATE 1 MG/ML IJ SOLN
INTRAMUSCULAR | Status: DC | PRN
  Administered 2012-10-16: 5 mg via INTRAVENOUS

## 2012-10-16 MED ORDER — ACETAMINOPHEN 10 MG/ML IV SOLN
INTRAVENOUS | Status: DC | PRN
  Administered 2012-10-16: 1000 mg via INTRAVENOUS

## 2012-10-16 MED ORDER — DEXTROSE 5 % IV SOLN
2.0000 g | INTRAVENOUS | Status: AC
  Administered 2012-10-16: 2 g via INTRAVENOUS

## 2012-10-16 MED ORDER — DEXAMETHASONE SODIUM PHOSPHATE 10 MG/ML IJ SOLN
INTRAMUSCULAR | Status: DC | PRN
  Administered 2012-10-16: 10 mg via INTRAVENOUS

## 2012-10-16 MED ORDER — TISSEEL VH 10 ML EX KIT
PACK | CUTANEOUS | Status: AC
  Filled 2012-10-16: qty 2

## 2012-10-16 MED ORDER — CEFOXITIN SODIUM-DEXTROSE 1-4 GM-% IV SOLR (PREMIX)
INTRAVENOUS | Status: AC
  Filled 2012-10-16: qty 100

## 2012-10-16 MED ORDER — TISSEEL VH 10 ML EX KIT
PACK | CUTANEOUS | Status: AC
  Filled 2012-10-16: qty 1

## 2012-10-16 MED ORDER — ONDANSETRON HCL 4 MG/2ML IJ SOLN
4.0000 mg | INTRAMUSCULAR | Status: DC | PRN
  Administered 2012-10-16 (×2): 4 mg via INTRAVENOUS
  Filled 2012-10-16 (×2): qty 2

## 2012-10-16 MED ORDER — HYDROMORPHONE HCL PF 1 MG/ML IJ SOLN
INTRAMUSCULAR | Status: AC
  Filled 2012-10-16: qty 1

## 2012-10-16 MED ORDER — UNJURY CHOCOLATE CLASSIC POWDER
2.0000 [oz_av] | Freq: Four times a day (QID) | ORAL | Status: DC
  Administered 2012-10-18 (×2): 2 [oz_av] via ORAL

## 2012-10-16 MED ORDER — SUCCINYLCHOLINE CHLORIDE 20 MG/ML IJ SOLN
INTRAMUSCULAR | Status: DC | PRN
  Administered 2012-10-16: 100 mg via INTRAVENOUS

## 2012-10-16 MED ORDER — MIDAZOLAM HCL 5 MG/5ML IJ SOLN
INTRAMUSCULAR | Status: DC | PRN
  Administered 2012-10-16: 2 mg via INTRAVENOUS

## 2012-10-16 MED ORDER — GLYCOPYRROLATE 0.2 MG/ML IJ SOLN
INTRAMUSCULAR | Status: DC | PRN
  Administered 2012-10-16: 0.6 mg via INTRAVENOUS

## 2012-10-16 MED ORDER — MORPHINE SULFATE 10 MG/ML IJ SOLN
2.0000 mg | INTRAMUSCULAR | Status: DC | PRN
  Administered 2012-10-16 – 2012-10-17 (×4): 4 mg via INTRAVENOUS
  Filled 2012-10-16 (×4): qty 1

## 2012-10-16 MED ORDER — UNJURY VANILLA POWDER: 2.0000 [oz_av] | Freq: Four times a day (QID) | ORAL | Status: DC

## 2012-10-16 MED ORDER — PROPOFOL 10 MG/ML IV BOLUS
INTRAVENOUS | Status: DC | PRN
  Administered 2012-10-16: 160 mg via INTRAVENOUS

## 2012-10-16 MED ORDER — TISSEEL VH 10 ML EX KIT
PACK | CUTANEOUS | Status: DC | PRN
  Administered 2012-10-16: 10 mL

## 2012-10-16 MED ORDER — ACETAMINOPHEN 160 MG/5ML PO SOLN
650.0000 mg | ORAL | Status: DC | PRN
  Filled 2012-10-16: qty 20.3

## 2012-10-16 MED ORDER — HYDROMORPHONE HCL PF 1 MG/ML IJ SOLN
0.2500 mg | INTRAMUSCULAR | Status: DC | PRN
  Administered 2012-10-16 (×4): 0.5 mg via INTRAVENOUS

## 2012-10-16 MED ORDER — ONDANSETRON HCL 4 MG/2ML IJ SOLN
INTRAMUSCULAR | Status: DC | PRN
  Administered 2012-10-16: 4 mg via INTRAVENOUS

## 2012-10-16 MED ORDER — PROMETHAZINE HCL 25 MG/ML IJ SOLN: 6.2500 mg | INTRAMUSCULAR | Status: DC | PRN

## 2012-10-16 MED ORDER — UNJURY CHICKEN SOUP POWDER: 2.0000 [oz_av] | Freq: Four times a day (QID) | ORAL | Status: DC

## 2012-10-16 MED ORDER — KCL IN DEXTROSE-NACL 20-5-0.45 MEQ/L-%-% IV SOLN
INTRAVENOUS | Status: AC
  Filled 2012-10-16: qty 1000

## 2012-10-16 MED ORDER — ROCURONIUM BROMIDE 100 MG/10ML IV SOLN
INTRAVENOUS | Status: DC | PRN
  Administered 2012-10-16 (×2): 10 mg via INTRAVENOUS
  Administered 2012-10-16: 40 mg via INTRAVENOUS
  Administered 2012-10-16 (×2): 10 mg via INTRAVENOUS
  Administered 2012-10-16: 5 mg via INTRAVENOUS

## 2012-10-16 MED ORDER — ACETAMINOPHEN 10 MG/ML IV SOLN
1000.0000 mg | Freq: Four times a day (QID) | INTRAVENOUS | Status: AC
  Administered 2012-10-16 – 2012-10-17 (×4): 1000 mg via INTRAVENOUS
  Filled 2012-10-16 (×6): qty 100

## 2012-10-16 MED ORDER — LACTATED RINGERS IV SOLN
INTRAVENOUS | Status: DC | PRN
  Administered 2012-10-16 (×3): via INTRAVENOUS

## 2012-10-16 MED ORDER — LACTATED RINGERS IR SOLN
Status: DC | PRN
  Administered 2012-10-16: 3000 mL

## 2012-10-16 MED ORDER — BUPIVACAINE-EPINEPHRINE 0.25% -1:200000 IJ SOLN
INTRAMUSCULAR | Status: AC
  Filled 2012-10-16: qty 1

## 2012-10-16 MED ORDER — OXYCODONE-ACETAMINOPHEN 5-325 MG/5ML PO SOLN
5.0000 mL | ORAL | Status: DC | PRN
  Administered 2012-10-18: 10 mL via ORAL
  Filled 2012-10-16: qty 10

## 2012-10-16 MED ORDER — FENTANYL CITRATE 0.05 MG/ML IJ SOLN
INTRAMUSCULAR | Status: DC | PRN
  Administered 2012-10-16 (×3): 50 ug via INTRAVENOUS

## 2012-10-16 MED ORDER — EPHEDRINE SULFATE 50 MG/ML IJ SOLN
INTRAMUSCULAR | Status: DC | PRN
  Administered 2012-10-16 (×2): 5 mg via INTRAVENOUS

## 2012-10-16 MED ORDER — LIDOCAINE HCL (CARDIAC) 20 MG/ML IV SOLN
INTRAVENOUS | Status: DC | PRN
  Administered 2012-10-16: 100 mg via INTRAVENOUS

## 2012-10-16 MED ORDER — ENOXAPARIN SODIUM 40 MG/0.4ML ~~LOC~~ SOLN
40.0000 mg | Freq: Two times a day (BID) | SUBCUTANEOUS | Status: DC
  Administered 2012-10-17 – 2012-10-18 (×3): 40 mg via SUBCUTANEOUS
  Filled 2012-10-16 (×5): qty 0.4

## 2012-10-16 MED ORDER — PANTOPRAZOLE SODIUM 40 MG IV SOLR
40.0000 mg | INTRAVENOUS | Status: DC
  Administered 2012-10-16 – 2012-10-17 (×2): 40 mg via INTRAVENOUS
  Filled 2012-10-16 (×3): qty 40

## 2012-10-16 MED ORDER — BUPIVACAINE-EPINEPHRINE 0.25% -1:200000 IJ SOLN
INTRAMUSCULAR | Status: DC | PRN
  Administered 2012-10-16: 50 mL

## 2012-10-16 SURGICAL SUPPLY — 79 items
APPLICATOR COTTON TIP 6IN STRL (MISCELLANEOUS) ×6 IMPLANT
APPLIER CLIP ROT 13.4 12 LRG (CLIP)
BENZOIN TINCTURE PRP APPL 2/3 (GAUZE/BANDAGES/DRESSINGS) ×3 IMPLANT
BLADE SURG 15 STRL LF DISP TIS (BLADE) IMPLANT
BLADE SURG 15 STRL SS (BLADE)
BLADE SURG SZ11 CARB STEEL (BLADE) ×3 IMPLANT
CABLE HIGH FREQUENCY MONO STRZ (ELECTRODE) ×3 IMPLANT
CANISTER SUCTION 2500CC (MISCELLANEOUS) ×3 IMPLANT
CLIP APPLIE ROT 13.4 12 LRG (CLIP) IMPLANT
CLIP SUT LAPRA TY ABSORB (SUTURE) ×6 IMPLANT
CLOTH BEACON ORANGE TIMEOUT ST (SAFETY) ×3 IMPLANT
COVER SURGICAL LIGHT HANDLE (MISCELLANEOUS) ×3 IMPLANT
CUTTER LINEAR ENDO ART 45 ETS (STAPLE) ×3 IMPLANT
DERMABOND ADVANCED (GAUZE/BANDAGES/DRESSINGS)
DERMABOND ADVANCED .7 DNX12 (GAUZE/BANDAGES/DRESSINGS) IMPLANT
DEVICE SUTURE ENDOST 10MM (ENDOMECHANICALS) ×3 IMPLANT
DISSECTOR BLUNT TIP ENDO 5MM (MISCELLANEOUS) IMPLANT
DRAIN PENROSE 18X1/4 LTX STRL (WOUND CARE) ×3 IMPLANT
DRAPE CAMERA CLOSED 9X96 (DRAPES) ×3 IMPLANT
DRAPE UTILITY XL STRL (DRAPES) ×3 IMPLANT
DRSG TEGADERM 2-3/8X2-3/4 SM (GAUZE/BANDAGES/DRESSINGS) ×15 IMPLANT
ELECT REM PT RETURN 9FT ADLT (ELECTROSURGICAL) ×3
ELECTRODE REM PT RTRN 9FT ADLT (ELECTROSURGICAL) ×2 IMPLANT
GAUZE SPONGE 4X4 16PLY XRAY LF (GAUZE/BANDAGES/DRESSINGS) ×3 IMPLANT
GLOVE BIO SURGEON STRL SZ7.5 (GLOVE) ×6 IMPLANT
GLOVE BIOGEL M STRL SZ7.5 (GLOVE) ×3 IMPLANT
GLOVE BIOGEL PI IND STRL 7.0 (GLOVE) ×4 IMPLANT
GLOVE BIOGEL PI INDICATOR 7.0 (GLOVE) ×2
GLOVE INDICATOR 8.0 STRL GRN (GLOVE) ×3 IMPLANT
GOWN STRL NON-REIN LRG LVL3 (GOWN DISPOSABLE) ×6 IMPLANT
GOWN STRL REIN XL XLG (GOWN DISPOSABLE) ×15 IMPLANT
HEMOSTAT SURGICEL 4X8 (HEMOSTASIS) IMPLANT
HOVERMATT SINGLE USE (MISCELLANEOUS) ×3 IMPLANT
KIT BASIN OR (CUSTOM PROCEDURE TRAY) ×3 IMPLANT
KIT GASTRIC LAVAGE 34FR ADT (SET/KITS/TRAYS/PACK) ×3 IMPLANT
MARKER SKIN DUAL TIP RULER LAB (MISCELLANEOUS) ×3 IMPLANT
NEEDLE SPNL 22GX3.5 QUINCKE BK (NEEDLE) ×3 IMPLANT
NS IRRIG 1000ML POUR BTL (IV SOLUTION) ×3 IMPLANT
PACK CARDIOVASCULAR III (CUSTOM PROCEDURE TRAY) ×3 IMPLANT
POUCH SPECIMEN RETRIEVAL 10MM (ENDOMECHANICALS) IMPLANT
RELOAD 45 VASCULAR/THIN (ENDOMECHANICALS) ×3 IMPLANT
RELOAD BLUE (STAPLE) ×6 IMPLANT
RELOAD ENDO STITCH 2.0 (ENDOMECHANICALS) ×10
RELOAD GOLD (STAPLE) ×3 IMPLANT
RELOAD STAPLE TA45 3.5 REG BLU (ENDOMECHANICALS) ×6 IMPLANT
RELOAD WHITE ECR60W (STAPLE) ×3 IMPLANT
SCALPEL HARMONIC ACE (MISCELLANEOUS) ×3 IMPLANT
SCISSORS LAP 5X35 DISP (ENDOMECHANICALS) ×3 IMPLANT
SEALANT SURGICAL APPL DUAL CAN (MISCELLANEOUS) ×3 IMPLANT
SET IRRIG TUBING LAPAROSCOPIC (IRRIGATION / IRRIGATOR) ×3 IMPLANT
SLEEVE ADV FIXATION 12X100MM (TROCAR) ×6 IMPLANT
SLEEVE ADV FIXATION 5X100MM (TROCAR) ×3 IMPLANT
SLEEVE ENDOPATH XCEL 5M (ENDOMECHANICALS) ×3 IMPLANT
SOLUTION ANTI FOG 6CC (MISCELLANEOUS) ×3 IMPLANT
SPONGE GAUZE 4X4 12PLY (GAUZE/BANDAGES/DRESSINGS) ×3 IMPLANT
STAPLE ECHEON FLEX 60 POW ENDO (STAPLE) ×3 IMPLANT
STAPLER VISISTAT 35W (STAPLE) ×3 IMPLANT
STRIP CLOSURE SKIN 1/2X4 (GAUZE/BANDAGES/DRESSINGS) ×3 IMPLANT
SUT ETHILON 3 0 PS 1 (SUTURE) IMPLANT
SUT MNCRL AB 4-0 PS2 18 (SUTURE) ×3 IMPLANT
SUT RELOAD ENDO STITCH 2 48X1 (ENDOMECHANICALS) ×12
SUT RELOAD ENDO STITCH 2.0 (ENDOMECHANICALS) ×8
SUT SILK 2 0 SH (SUTURE) ×3 IMPLANT
SUT VIC AB 2-0 SH 27 (SUTURE) ×1
SUT VIC AB 2-0 SH 27X BRD (SUTURE) ×2 IMPLANT
SUTURE RELOAD END STTCH 2 48X1 (ENDOMECHANICALS) ×12 IMPLANT
SUTURE RELOAD ENDO STITCH 2.0 (ENDOMECHANICALS) ×8 IMPLANT
SYR 20CC LL (SYRINGE) ×3 IMPLANT
SYR 50ML LL SCALE MARK (SYRINGE) ×3 IMPLANT
SYR CONTROL 10ML LL (SYRINGE) ×3 IMPLANT
TOWEL OR 17X26 10 PK STRL BLUE (TOWEL DISPOSABLE) ×3 IMPLANT
TRAY FOLEY CATH 14FRSI W/METER (CATHETERS) ×3 IMPLANT
TROCAR BALLN 12MMX100 BLUNT (TROCAR) ×3 IMPLANT
TROCAR BLADELESS OPT 5 100 (ENDOMECHANICALS) IMPLANT
TROCAR ENDOPATH XCEL 12X100 BL (ENDOMECHANICALS) ×9 IMPLANT
TROCAR XCEL 12X100 BLDLESS (ENDOMECHANICALS) IMPLANT
TUBING ENDO SMARTCAP (MISCELLANEOUS) ×3 IMPLANT
TUBING FILTER THERMOFLATOR (ELECTROSURGICAL) ×3 IMPLANT
WATER STERILE IRR 1500ML POUR (IV SOLUTION) ×3 IMPLANT

## 2012-10-16 NOTE — Anesthesia Preprocedure Evaluation (Signed)
Anesthesia Evaluation  Patient identified by MRN, date of birth, ID band Patient awake    Reviewed: Allergy & Precautions, H&P , NPO status , Patient's Chart, lab work & pertinent test results, reviewed documented beta blocker date and time   Airway Mallampati: II TM Distance: >3 FB Neck ROM: full    Dental No notable dental hx.    Pulmonary former smoker,  breath sounds clear to auscultation  Pulmonary exam normal       Cardiovascular Exercise Tolerance: Good negative cardio ROS  Rhythm:regular Rate:Normal     Neuro/Psych  Headaches, PSYCHIATRIC DISORDERS    GI/Hepatic negative GI ROS, Neg liver ROS, GERD-  Medicated,  Endo/Other  negative endocrine ROSMorbid obesity  Renal/GU negative Renal ROS  negative genitourinary   Musculoskeletal negative musculoskeletal ROS (+)   Abdominal   Peds negative pediatric ROS (+)  Hematology negative hematology ROS (+)   Anesthesia Other Findings   Reproductive/Obstetrics negative OB ROS                           Anesthesia Physical Anesthesia Plan  ASA: II  Anesthesia Plan: General ETT   Post-op Pain Management:    Induction:   Airway Management Planned:   Additional Equipment:   Intra-op Plan:   Post-operative Plan:   Informed Consent: I have reviewed the patients History and Physical, chart, labs and discussed the procedure including the risks, benefits and alternatives for the proposed anesthesia with the patient or authorized representative who has indicated his/her understanding and acceptance.   Dental Advisory Given  Plan Discussed with: CRNA  Anesthesia Plan Comments:         Anesthesia Quick Evaluation

## 2012-10-16 NOTE — Anesthesia Postprocedure Evaluation (Signed)
  Anesthesia Post-op Note  Patient: Sheila Rodriguez  Procedure(s) Performed: Procedure(s) (LRB): LAPAROSCOPIC ROUX-EN-Y GASTRIC (N/A) UPPER GI ENDOSCOPY ()  Patient Location: PACU  Anesthesia Type: General  Level of Consciousness: awake and alert   Airway and Oxygen Therapy: Patient Spontanous Breathing  Post-op Pain: mild  Post-op Assessment: Post-op Vital signs reviewed, Patient's Cardiovascular Status Stable, Respiratory Function Stable, Patent Airway and No signs of Nausea or vomiting  Last Vitals:  Filed Vitals:   10/16/12 1115  BP: 161/87  Pulse: 100  Temp: 36.8 C  Resp: 14    Post-op Vital Signs: stable   Complications: No apparent anesthesia complications

## 2012-10-16 NOTE — Op Note (Signed)
Name:  Sheila Rodriguez MRN: [id number] Date of Surgery: 10/16/2012  Preop Diagnosis:  Morbid Obesity, S/P RYGB  Postop Diagnosis:  Morbid Obesity, S/P RYGB  Procedure:  Upper endoscopy  (Intraoperative)  Surgeon:  Ovidio Kin, M.D.  Anesthesia:  GET  Indications for procedure: Sheila Rodriguez is a 37 y.o. female whose primary care physician is Lenoard Aden, MD and has completed a Roux-en-Y gastric bypass today by Dr. Andrey Campanile.  I am doing an intraoperative upper endoscopy to evaluate the gastric pouch and the gastro-jejunal anastomosis.  Operative Note: The patient is under general anesthesia.  Dr. Andrey Campanile is laparoscoping the patient while I do an upper endoscopy to evaluate the stomach pouch and gastrojejunal anastomosis.  With the patient intubated, I passed the Olympus endoscope without difficulty down the esophagus.  The esophago-gastric junction was at 38 cm.  The gastro-jejunal anastomosis was at 43 cm.  The mucosa of the stomach looked viable and the staple line was intact without bleeding.  The gastro-jejunal anastomosis looked okay.  While I insufflated the stomach pouch with air, Dr. Andrey Campanile clamped off the efferent limb of the jejunum.  He then flooded the upper abdomen with saline to put the gastric pouch and gastro-jejunal anastomosis under saline.  There was no bubbling or evidence of a leak.  Photos were taken of the anastomosis.  The scope was then withdrawn.  The esophagus was unremarkable and the patient tolerated the endoscopy without difficulty.  Ovidio Kin, MD, [facility] Surgery Pager: [phone] Office phone:  [phone]

## 2012-10-16 NOTE — H&P (View-Only) (Signed)
Patient ID: Sheila Rodriguez, female   DOB: 03/11/1976, 37 y.o.   MRN: [id number]  Chief Complaint  Patient presents with  . Bariatric Pre-op    HPI Sheila Rodriguez is a 37 y.o. female.   HPI 37-year-old morbidly obese Caucasian female comes in today for her preoperative appointment. She is currently scheduled to undergo laparoscopic Roux-en-Y gastric bypass next week. She was initially seen in the office in October 2013. At that time she weighed 271 pounds. Today she weighs 264 pounds. She has already begun on her preoperative diet. She denies any significant changes to her medical history since she was last seen. She denies any surgeries or medical diagnoses. She denies any trips to the emergency room. She states that she has become a little bit constipated since starting the preoperative protein shakes. Past Medical History  Diagnosis Date  . Depression   . Migraines   . Morbid obesity   . Hyperlipidemia     TG - 178 (07/2012) per pt  . GERD (gastroesophageal reflux disease)     Past Surgical History  Procedure Date  . Laparoscopic cholecystectomy 2000  . Tubal ligation 07/2008    Family History  Problem Relation Age of Onset  . Breast cancer Mother 41  . Colon cancer Paternal Aunt   . Bladder Cancer Paternal Aunt   . Kidney cancer Maternal Uncle     Social History History  Substance Use Topics  . Smoking status: Former Smoker    Quit date: 12/27/2003  . Smokeless tobacco: Never Used  . Alcohol Use: Yes     Comment: once a month    No Known Allergies  Current Outpatient Prescriptions  Medication Sig Dispense Refill  . acetaminophen (TYLENOL) 500 MG tablet Take 500 mg by mouth every 6 (six) hours as needed.      . buPROPion (WELLBUTRIN XL) 300 MG 24 hr tablet Take 300 mg by mouth every morning.       . FLUoxetine (PROZAC) 20 MG capsule Take 20 mg by mouth daily.       . ibuprofen (ADVIL,MOTRIN) 400 MG tablet Take 400 mg by mouth every 6 (six) hours as needed.  For pain      . ranitidine (ZANTAC) 75 MG tablet Take 75 mg by mouth 2 (two) times daily.      . peg 3350 powder (MOVIPREP) 100 G SOLR Take 1 kit (100 g total) by mouth once.  1 kit  0    Review of Systems Review of Systems  Constitutional: Negative for fever, chills and unexpected weight change.  HENT: Negative for hearing loss, congestion, sore throat, trouble swallowing and voice change.   Eyes: Negative for visual disturbance.  Respiratory: Negative for cough and wheezing.   Cardiovascular: Negative for chest pain, palpitations and leg swelling.  Gastrointestinal: Positive for constipation. Negative for nausea, vomiting, abdominal pain, diarrhea, blood in stool, abdominal distention and anal bleeding.  Genitourinary: Negative for hematuria, vaginal bleeding and difficulty urinating.  Musculoskeletal: Negative for arthralgias.  Skin: Negative for rash and wound.  Neurological: Negative for seizures, syncope and headaches.  Hematological: Negative for adenopathy. Does not bruise/bleed easily.  Psychiatric/Behavioral: Negative for confusion.    Blood pressure 146/88, pulse 84, temperature 97.7 F (36.5 C), temperature source Temporal, resp. rate 20, height 5' 6" (1.676 m), weight 264 lb (119.75 kg), last menstrual period 10/05/2012.  Physical Exam Physical Exam  Vitals reviewed. Constitutional: She is oriented to person, place, and time. She appears well-developed and well-nourished.   No distress.       Morbid obesity  HENT:  Head: Normocephalic and atraumatic.  Right Ear: External ear normal.  Left Ear: External ear normal.  Eyes: Conjunctivae normal are normal. No scleral icterus.  Neck: Normal range of motion. Neck supple. No tracheal deviation present. No thyromegaly present.  Cardiovascular: Normal rate, regular rhythm and normal heart sounds.   Pulmonary/Chest: Effort normal and breath sounds normal. No respiratory distress. She has no wheezes.  Abdominal: Soft. She  exhibits no distension. There is no tenderness. There is no rebound.       Well healed trocar sites  Musculoskeletal: She exhibits no edema and no tenderness.  Neurological: She is alert and oriented to person, place, and time. She exhibits normal muscle tone.  Skin: Skin is warm and dry. No rash noted. She is not diaphoretic. No erythema. No pallor.  Psychiatric: She has a normal mood and affect. Her behavior is normal. Judgment and thought content normal.    Data Reviewed My office note from 06/2012 ugi - reflux Wt loss evaluation labs - TG 178 o/w normal  Assessment    Morbid obesity BMI 42.6 GERD Elevated triglycerides    Plan    I congratulated her on her preoperative weight loss today. She has lost 7 pounds since I initially saw her. I encouraged her to continue with her preoperative diet. With respect to the constipation we talked about milk of magnesia or MiraLAX. He also discussed the importance of routine exercise. We discussed potentially getting a pedometer and tracking her steps. All of her questions were asked and answered. We reviewed the typical postoperative course. She was given a prescription for her bowel prep. I look forward to working with her.  Genecis Veley M. Kemyah Buser, MD, FACS General, Bariatric, & Minimally Invasive Surgery [facility] [place]        Natoshia Souter M 10/11/2012, 11:42 AM    

## 2012-10-16 NOTE — Op Note (Signed)
Preop diagnosis:  1. Morbid obesity (BMI 42.6)  2. GERD 3. Elevated triglycerides  Postop diagnosis:  1. same  Surgical procedure: Laparoscopic Roux-en-Y gastric bypass; upper endoscopy  Surgeon: Atilano Ina, M.D. FACS  Asst.: Ovidio Kin, MD FACS  Anesthesia: General plus 0.25% marcaine with epi  Complications: None   EBL: Minimal   Drains: None   Disposition: PACU in good condition   Indications for procedure: 37yo WF with morbid obesity who has been unsuccessful at sustained weight loss. The patient's comorbidities are listed above. We discussed the risk and benefits of surgery including but not limited to anesthesia risk, bleeding, infection, blood clot formation, anastomotic leak, anastomotic stricture, ulcer formation, death, respiratory complications, intestinal blockage, internal hernia, gallstone formation, vitamin and nutritional deficiencies, injury to surrounding structures, failure to lose weight and mood changes.   Description of procedure: Patient is brought to the operating room and general anesthesia induced. The patient had received preoperative broad-spectrum IV antibiotics and subcutaneous heparin. The abdomen was widely sterilely prepped with Chloraprep and draped. Patient timeout was performed and correct patient and procedure confirmed. Access was obtained with a 12 mm Optiview trocar in the left upper quadrant and pneumoperitoneum established without difficulty. Under direct vision 12 mm trocars were placed laterally in the right upper quadrant, right upper quadrant midclavicular line, and to the left and above the umbilicus for the camera port. A 5 mm trocar was placed laterally in the left upper quadrant.  The omentum was brought into the upper abdomen and the transverse mesocolon elevated and the ligament of Treitz clearly identified. A 40 cm biliopancreatic limb was then carefully measured from the ligament of Treitz. The small intestine was divided at this  point with a single firing of the white load linear stapler. A Penrose drain was sutured to the end of the Roux-en-Y limb for later identification. A 100 cm Roux-en-Y limb was then carefully measured. At this point a side-to-side anastomosis was created between the Roux limb and the end of the biliopancreatic limb. This was accomplished with a single firing of the 45 mm white load linear stapler. The common enterotomy was closed with a running 2-0 Vicryl begun at either end of the enterotomy and tied centrally.  I ended up placing a additional 2-0 vicryl u stitch where there was a small gap in the closure. The mesenteric defect was then closed with running 2-0 silk. Tisseel sealant was then placed over the anatomosis. The omentum was then divided with the harmonic scalpel up towards the transverse colon to allow mobility of the Roux limb toward the gastric pouch. The patient was then placed in steep reversed Trendelenburg. Through a 5 mm subxiphoid site the [facility] retractor was placed and the left lobe of the liver elevated with excellent exposure of the upper stomach and hiatus. The angle of Hiss was then mobilized with the harmonic scalpel. A 4 cm gastric pouch was then carefully measured along the lesser curve of the stomach. Dissection was carried along the lesser curve at this point with the Harmonic scalpel working carefully back toward the lesser sac at right angles to the lesser curve. The free lesser sac was then entered. After being sure all tubes were removed from the stomach an initial firing of the gold load 60 mm linear stapler was fired at right angles across the lesser curve for about 4 cm. The gastric pouch was further mobilized posteriorly and then the pouch was completed with 2 further firings of the 60 mm  blue load linear stapler and 1 final firing of a blue load 45mm linear stapler up through the previously dissected angle of His. It was ensured that the pouch was completely mobilized away  from the gastric remnant. This created a nice tubular 4-5 cm gastric pouch. The Roux limb was then brought up in an antecolic fashion with the candycane facing to the patient's left without undue tension. The gastrojejunostomy was created with an initial posterior row of 2-0 Vicryl between the Roux limb and the staple line of the gastric pouch. Enterotomies were then made in the gastric pouch and the Roux limb with the harmonic scalpel and at approximately 2-2-1/2 cm anastomosis was created with a single firing of the 45mm blue load linear stapler. The staple line was inspected and was intact without bleeding. The common enterotomy was then closed with running 2-0 Vicryl begun at either end and tied centrally. The Ewall tube was then easily passed through the anastomosis and an outer anterior layer of running 2-0 Vicryl was placed. The Ewald tube was removed. With the outlet of the gastrojejunostomy clamped and under saline irrigation the assistant performed upper endoscopy and with the gastric pouch tensely distended with air-there was no evidence of leak on this test. The pouch was desufflated. The Vonita Moss defect was closed with running 2-0 silk. The abdomen was inspected for any evidence of bleeding or bowel injury and everything looked fine. The Nathanson retractor was removed under direct vision after coating the anastomosis with Tisseel tissue sealant. All CO2 was evacuated and trochars removed. Skin incisions were closed with 4-0 monocryl in a subcuticular fashion followed by benzoin, steri-strips and occlusive dressings. Sponge needle and instrument counts were correct. The patient was taken to the PACU in good condition.    Mary Sella. Andrey Campanile, MD, FACS General, Bariatric, & Minimally Invasive Surgery [facility], [place]

## 2012-10-16 NOTE — Transfer of Care (Signed)
Immediate Anesthesia Transfer of Care Note  Patient: Sheila Rodriguez  Procedure(s) Performed: Procedure(s) (LRB) with comments: LAPAROSCOPIC ROUX-EN-Y GASTRIC (N/A) UPPER GI ENDOSCOPY ()  Patient Location: PACU  Anesthesia Type:General  Level of Consciousness: sedated  Airway & Oxygen Therapy: Patient Spontanous Breathing and Patient connected to face mask oxygen  Post-op Assessment: Report given to PACU RN and Post -op Vital signs reviewed and stable  Post vital signs: Reviewed and stable  Complications: No apparent anesthesia complications

## 2012-10-16 NOTE — Interval H&P Note (Signed)
History and Physical Interval Note:  10/16/2012 7:15 AM  Sheila Rodriguez  has presented today for surgery, with the diagnosis of morbid obesity   The various methods of treatment have been discussed with the patient and family. After consideration of risks, benefits and other options for treatment, the patient has consented to  Procedure(s) (LRB) with comments: LAPAROSCOPIC ROUX-EN-Y GASTRIC (N/A) UPPER GI ENDOSCOPY () as a surgical intervention .  The patient's history has been reviewed, patient examined, no change in status, stable for surgery.  I have reviewed the patient's chart and labs.  Questions were answered to the patient's satisfaction.    Mary Sella. Andrey Campanile, MD, FACS General, Bariatric, & Minimally Invasive Surgery [facility], [place]  [facility] M

## 2012-10-17 ENCOUNTER — Encounter (HOSPITAL_COMMUNITY): Payer: Self-pay | Admitting: General Surgery

## 2012-10-17 ENCOUNTER — Inpatient Hospital Stay (HOSPITAL_COMMUNITY): Payer: BC Managed Care – PPO

## 2012-10-17 LAB — CBC WITH DIFFERENTIAL/PLATELET
Eosinophils Relative: 0 % (ref 0–5)
HCT: 36.8 % (ref 36.0–46.0)
Hemoglobin: 12.1 g/dL (ref 12.0–15.0)
Lymphocytes Relative: 20 % (ref 12–46)
Lymphs Abs: 2.4 10*3/uL (ref 0.7–4.0)
MCV: 83.4 fL (ref 78.0–100.0)
Monocytes Absolute: 1 10*3/uL (ref 0.1–1.0)
Monocytes Relative: 8 % (ref 3–12)
Neutro Abs: 8.8 10*3/uL — ABNORMAL HIGH (ref 1.7–7.7)
WBC: 12.2 10*3/uL — ABNORMAL HIGH (ref 4.0–10.5)

## 2012-10-17 MED ORDER — IOHEXOL 300 MG/ML  SOLN: 50.0000 mL | Freq: Once | INTRAMUSCULAR | Status: AC | PRN

## 2012-10-17 NOTE — Care Management Note (Signed)
    Page 1 of 1   10/17/2012     10:58:16 AM   CARE MANAGEMENT NOTE 10/17/2012  Patient:  Sheila Rodriguez, Sheila Rodriguez   Account Number:  [id number]  Date Initiated:  10/17/2012  Documentation initiated by:  Lorenda Ishihara  Subjective/Objective Assessment:   37 yo female admitted s/p Laparoscopic Roux-en-Y gastric bypass. PTA lived at home with spouse.     Action/Plan:   Home when stable   Anticipated DC Date:  10/19/2012   Anticipated DC Plan:  HOME/SELF CARE      DC Planning Services  CM consult      Choice offered to / List presented to:             Status of service:  Completed, signed off Medicare Important Message given?   (If response is "NO", the following Medicare IM given date fields will be blank) Date Medicare IM given:   Date Additional Medicare IM given:    Discharge Disposition:  HOME/SELF CARE  Per UR Regulation:  Reviewed for med. necessity/level of care/duration of stay  If discussed at Long Length of Stay Meetings, dates discussed:    Comments:

## 2012-10-17 NOTE — Progress Notes (Signed)
Patient is alert and oriented denies nausea, vomiting, burping, or bowel movement.  Patient with minimal abdominal discomfort, relief with prn medications.  Patient is ambulating in hallways, and using incentive spirometer.  Patient remains npo, UGI results pending.  Discharge instructions given to patient for review, will go over in depth prior to discharge.    Quenton Fetter, Rn

## 2012-10-17 NOTE — Progress Notes (Signed)
1 Day Post-Op  Subjective: Had an ok night. No n/reflux. Pain ok. Walked in halls last pm  Objective: Vital signs in last 24 hours: Temp:  [97.7 F (36.5 C)-98.6 F (37 C)] 98.6 F (37 C) (01/21 0620) Pulse Rate:  [88-114] 88  (01/21 0620) Resp:  [8-20] 18  (01/21 0620) BP: (118-168)/(68-95) 118/73 mmHg (01/21 0620) SpO2:  [93 %-100 %] 99 % (01/21 0620) Weight:  [263 lb 4 oz (119.409 kg)] 263 lb 4 oz (119.409 kg) (01/20 1200) Last BM Date: 10/15/12  Intake/Output from previous day: 01/20 0701 - 01/21 0700 In: 5662.5 [I.V.:5362.5; IV Piggyback:300] Out: 1400 [Urine:1400] Intake/Output this shift:    Alert, nad cta with dec BS at bases Reg Soft, obese, +BS; mild approp TTP +SCDs  Lab Results:   Basename 10/17/12 0410 10/16/12 1348  WBC 12.2* --  HGB 12.1 12.8  HCT 36.8 39.4  PLT 288 --   BMET No results found for this basename: NA:2,K:2,CL:2,CO2:2,GLUCOSE:2,BUN:2,CREATININE:2,CALCIUM:2 in the last 72 hours PT/INR No results found for this basename: LABPROT:2,INR:2 in the last 72 hours ABG No results found for this basename: PHART:2,PCO2:2,PO2:2,HCO3:2 in the last 72 hours  Studies/Results: No results found.  Anti-infectives: Anti-infectives     Start     Dose/Rate Route Frequency Ordered Stop   10/16/12 0540   cefOXitin (MEFOXIN) 2 g in dextrose 5 % 50 mL IVPB        2 g 100 mL/hr over 30 Minutes Intravenous On call to O.R. 10/16/12 0540 10/16/12 0724          Assessment/Plan: s/p Procedure(s) (LRB) with comments: LAPAROSCOPIC ROUX-EN-Y GASTRIC (N/A) UPPER GI ENDOSCOPY ()  Doing well - afebrile, no tachy Hgb stable - cont VTE prophylaxis Awaiting UGI this am - if ok  Will start water/ice chips IS, ambulate  Mary Sella. Andrey Campanile, MD, FACS General, Bariatric, & Minimally Invasive Surgery [facility], [place]   LOS: 1 day    Atilano Ina 10/17/2012

## 2012-10-18 DIAGNOSIS — K219 Gastro-esophageal reflux disease without esophagitis: Secondary | ICD-10-CM | POA: Diagnosis present

## 2012-10-18 LAB — CBC WITH DIFFERENTIAL/PLATELET
Eosinophils Relative: 1 % (ref 0–5)
HCT: 36.2 % (ref 36.0–46.0)
Hemoglobin: 11.6 g/dL — ABNORMAL LOW (ref 12.0–15.0)
Lymphocytes Relative: 24 % (ref 12–46)
Lymphs Abs: 2.1 10*3/uL (ref 0.7–4.0)
MCV: 84.4 fL (ref 78.0–100.0)
Monocytes Absolute: 0.7 10*3/uL (ref 0.1–1.0)
Monocytes Relative: 8 % (ref 3–12)
Platelets: 275 10*3/uL (ref 150–400)
RBC: 4.29 MIL/uL (ref 3.87–5.11)
WBC: 8.7 10*3/uL (ref 4.0–10.5)

## 2012-10-18 MED ORDER — BUPROPION HCL 100 MG PO TABS: 100.0000 mg | ORAL_TABLET | Freq: Three times a day (TID) | ORAL | Status: DC

## 2012-10-18 MED ORDER — OXYCODONE-ACETAMINOPHEN 5-325 MG/5ML PO SOLN: 5.0000 mL | ORAL | Status: DC | PRN

## 2012-10-18 NOTE — Discharge Summary (Signed)
Physician Discharge Summary  Sheila Rodriguez EXB:[id number] DOB: 1976/06/04 DOA: 10/16/2012  PCP: Lenoard Aden, MD  Admit date: 10/16/2012 Discharge date: 10/18/2012  Recommendations for Outpatient Follow-up:  1.   Follow-up Information    Follow up with Atilano Ina, MD,FACS. On 11/03/2012. (11 AM)    Contact information:   [address] [place] [address] [phone]         Discharge Diagnoses:  Patient Active Problem List  Diagnosis  . Obesity, Class III, BMI 40-49.9 (morbid obesity)  . GERD (gastroesophageal reflux disease)  - elevated triglycerides   Surgical Procedure: LAPAROSCOPIC ROUX EN Y GASTRIC BYPASS, UPPER ENDOSCOPY  Discharge Condition: GOOD Disposition: TO HOME  Diet recommendation: POSTOP GASTRIC BYPASS DIET - LIQUIDS  Filed Weights   10/16/12 0527 10/16/12 1200  Weight: 263 lb 4 oz (119.409 kg) 263 lb 4 oz (119.409 kg)    Hospital Course:  37 yo morbidly obese WF presented for planned LRYGB. She underwent the procedure on 10/16/12. She was maintained on perioperative VTE prophylaxis and her hgb remained stable. On POD 1 she underwent a UGI which showed no evidence of contrast extravasation and passage of contrast into the roux limb. She was begun on water and ice chips which she tolerated. On POD 2 she was tolerating water and was advanced to protein shakes which she tolerated. She was having flatus. She had remained afebrile without tachycardia. She was ambulating without difficulty. Her vitals were stable.   BP 114/75  Pulse 86  Temp 98.1 F (36.7 C) (Oral)  Resp 18  Ht 5\' 6"  (1.676 m)  Wt 263 lb 4 oz (119.409 kg)  BMI 42.49 kg/m2  SpO2 93%  LMP 10/05/2012  Gen: alert, NAD, non-toxic appearing Pupils: equal, no scleral icterus Pulm: Lungs clear to auscultation, symmetric chest rise CV: regular rate and rhythm Abd: soft, nontender, nondistended. No cellulitis.  Ext: no edema, no calf tenderness Skin: no rash, no  jaundice   Discharge Instructions See bariatric discharge instructions  Discharge Orders    Future Appointments: Provider: Department: Dept Phone: Center:   10/31/2012 4:00 PM Ndm[facility] Post-Op Class [facility] [phone] NDM   11/03/2012 11:00 AM Atilano Ina, MD,FACS [facility], [place] [phone] None       Medication List     As of 10/18/2012  8:47 AM    STOP taking these medications         buPROPion 300 MG 24 hr tablet   Commonly known as: WELLBUTRIN XL      ibuprofen 400 MG tablet   Commonly known as: ADVIL,MOTRIN      peg 3350 powder 100 G Solr   Commonly known as: MOVIPREP      TAKE these medications         acetaminophen 500 MG tablet   Commonly known as: TYLENOL   Take 500 mg by mouth every 6 (six) hours as needed.      FLUoxetine 20 MG capsule   Commonly known as: PROZAC   Take 20 mg by mouth daily.      oxyCODONE-acetaminophen 5-325 MG/5ML solution   Commonly known as: ROXICET   Take 5-10 mLs by mouth every 4 (four) hours as needed.      ranitidine 75 MG tablet   Commonly known as: ZANTAC   Take 75 mg by mouth 2 (two) times daily.           Follow-up Information    Follow  up with Atilano Ina, MD,FACS. On 11/03/2012. (11 AM)    Contact information:   [address] [place] [address] [phone]           The results of significant diagnostics from this hospitalization (including imaging, microbiology, ancillary and laboratory) are listed below for reference.    Significant Diagnostic Studies: Dg Ugi W/water Sol Cm  10/17/2012  *RADIOLOGY REPORT*  Clinical Data:  Postop gastric bypass.  UPPER GI SERIES WITH KUB  Technique:  Routine upper GI series was performed with water soluble contrast.  Fluoroscopy Time: 0.5 minutes  Comparison:  Upper GI 07/26/2012  Findings: Initial KUB demonstrates cholecystectomy clips in the right upper quadrant.  No evidence of bowel obstruction or  free air.  Small volume of water-soluble contrast was ingested (30 cc). Contrast flowed easily through the gas esophageal junction. Contrast flowed readily through the gastrojejunostomy without evidence obstruction or leak.  IMPRESSION: No evidence of obstruction or leak at the gastric jejunostomy following gastric bypass surgery.   Original Report Authenticated By: Genevive Bi, M.D.     Microbiology: Recent Results (from the past 240 hour(s))  SURGICAL PCR SCREEN     Status: Normal   Collection Time   10/11/12  8:18 AM      Component Value Range Status Comment   MRSA, PCR NEGATIVE  NEGATIVE Final    Staphylococcus aureus NEGATIVE  NEGATIVE Final      CBC:  Lab 10/18/12 0425 10/17/12 1640 10/17/12 0410 10/16/12 1348  WBC 8.7 -- 12.2* --  NEUTROABS 5.8 -- 8.8* --  HGB 11.6* 12.0 12.1 12.8  HCT 36.2 37.7 36.8 39.4  MCV 84.4 -- 83.4 --  PLT 275 -- 288 --    Active Problems:  Obesity, Class III, BMI 40-49.9 (morbid obesity)  GERD (gastroesophageal reflux disease)   Time coordinating discharge: 15 mins Signed:  Atilano Ina, MD [facility], [place] [phone] 10/18/2012, 8:47 AM

## 2012-10-18 NOTE — Progress Notes (Signed)
Patient is alert and oriented, up ambulating in hallway.  Patient with minimal pain.  Patient denies and nausea or vomiting.  Patient is tolerating water and protein shakes.  Patient has follow up appointments with [facility] and [facility].  BELT program discussed with patient.  Patient is aware of hospital support group.  The following discharge instructed listed below reviewed with patient.  Quenton Fetter, Rn  GASTRIC BYPASS/SLEEVE DISCHARGE INSTRUCTIONS  Drs. Fredrik Rigger, [place], [place], and [place] Call if you have any problems.   Call [phone] and ask for the surgeon on call.    If you need immediate assistance come to the ER at [facility]. Tell the ER personnel that you are a new post-op gastric bypass patient. Signs and symptoms to report:   Severe vomiting or nausea. If you cannot tolerate clear liquids for longer than 1 day, you need to call your surgeon.    Abdominal pain which does not get better after taking your pain medication   Fever greater than 101 F degree   Difficulty breathing   Chest pain    Redness, swelling, drainage, or foul odor at incision sites    If your incisions open or pull apart   Swelling or pain in calf (lower leg)   Diarrhea, frequent watery, uncontrolled bowel movements.   Constipation, (no bowel movements for 3 days) if this occurs, Take Milk of Magnesia, 2 tablespoons by mouth, 3 times a day for 2 days if needed.  Call your doctor if constipation continues. Stop taking Milk of Magnesia once you have had a bowel movement. You may also use Miralax according to the label instructions.   Anything you consider "abnormal for you".   Normal side effects after Surgery:   Unable to sleep at night or concentrate   Irritability   Being tearful (crying) or depressed   These are common complaints, possibly related to your anesthesia, stress of surgery and change in lifestyle, that usually go away a few weeks after surgery.  If these feelings continue, call your  medical doctor.  Wound Care You may have surgical glue, steri-strips, or staples over your incisions after surgery.  Surgical glue:  Looks like a clear film over your incisions and will wear off gradually. Steri-strips: Strips of tape over your incisions. You may notice a yellowish color on the skin underneath the steri-strips. This is a substance used to make the steri-strips stick better. Do not pull the steri-strips off - let them fall off.  Staples: Cherlynn Polo may be removed before you leave the hospital. If you go home with staples, call [facility] [phone]) for an appointment with your surgeon's nurse to have staples removed in 7 - 10 days. Showering: You may shower two days after your surgery unless otherwise instructed by your surgeon. Wash gently around wounds with warm soapy water, rinse well, and gently pat dry.  If you have a drain, you may need someone to hold this while you shower. Avoid tub baths until staples are removed and incisions are healed.    Medications   Medications should be liquid or crushed if larger than the size of a dime.  Extended release pills should not be crushed.   Depending on the size and number of medications you take, you may need to stagger/change the time you take your medications so that you do not over-fill your pouch.    Make sure you follow-up with your primary care physician to make medication adjustments needed during rapid weight loss  and life-style adjustment.   If you are diabetic, follow up with the doctor that prescribes your diabetes medication(s) within one week after surgery and check your blood sugar regularly.   Do not drive while taking narcotics!   Do not take acetaminophen (Tylenol) and Roxicet or Lortab Elixir at the same time since these pain medications contain acetaminophen.  Diet at home: (First 2 Weeks) You will see the nutritionist two weeks after your surgery. She will advance your diet if you are tolerating  liquids well. Once at home, if you have severe vomiting or nausea and cannot tolerate clear liquids lasting longer than 1 day, call your surgeon.  Begin high protein shake 2 ounces every 3 hours, 5 - 6 times per day.  Gradually increase the amount you drink as tolerated.  You may find it easier to slowly sip shakes throughout the day.  It is important to get your proteins in first.   Protein Shake   Drink at least 2 ounces of shake 5-6 times per day   Each serving of protein shakes should have a minimum of 15 grams of protein and no more than 5 grams of carbohydrate    Increase the amount of protein shake you drink as tolerated   Protein powder may be added to fluids such as non-fat milk or Lactaid milk (limit to 20 grams added protein powder per serving   The initial goal is to drink at least 8 ounces of protein shake/drink per day (or as directed by the nutritionist). Some examples of protein shakes are [employer], [employer], EAS Edge HP, and Unjury. Hydration   Gradually increase the amount of water and other liquids as tolerated (See Acceptable Fluids)   Gradually increase the amount of protein shake as tolerated     Sip fluids slowly and throughout the day   May use Sugar substitutes, use sparingly (limit to 6 - 8 packets per day). Your fluid goal is 64 ounces of fluid daily. It may take a few weeks to build up to this.         32 oz (or more) should be clear liquids and 32 oz (or more) should be full liquids.         Liquids should not contain sugar, caffeine, or carbonation! Acceptable Fluids Clear Liquids:   Water or Sugar-free flavored water, Fruit H2O   Decaffeinated coffee or tea (sugar-free)   Crystal Lite, Wyler's Lite, Minute Maid Lite   Sugar-free Jell-O   Bouillon or broth   Sugar-free Popsicle:   *Less than 20 calories each; Limit 1 per day   Full Liquids:              Protein Shakes/Drinks + 2 choices per day of other full liquids shown below.    Other full  liquids must be: No more than 12 grams of Carbs per serving,  No more than 3 grams of Fat per serving   Strained low-fat cream soup   Non-Fat milk   Fat-free Lactaid Milk   Sugar-free yogurt (Dannon Lite & Fit) Vitamins and Minerals (Start 1 day after surgery unless otherwise directed)   2 Chewable Multivitamin / Multimineral Supplement (i.e. Centrum for Adults)   Chewable Calcium Citrate with Vitamin D-3. Take 1500 mg each day.           (Example: 3 Chewable Calcium Plus 600 with Vitamin D-3 can be found at [facility])         Vitamin B-12, 350 - 500  micrograms (oral tablet) each day   Do not mix multivitamins containing iron with calcium supplements; take 2 hours   apart   Do not substitute Tums (calcium carbonate) for your calcium   Menstruating women and those at risk for anemia may need extra iron. Talk with your doctor to see if you need additional iron.    If you need extra iron:  Total daily Iron recommendations (including Vitamins) = 50 - 100 mg Iron/day Do not stop taking or change any vitamins or minerals until you talk to your nutritionist or surgeon. Your nutritionist and / or physician must approve all vitamin and mineral supplements. Exercise For maximum success, begin exercising as soon as your doctor recommends. Make sure your physician approves any physical activity.   Depending on fitness level, begin with a simple walking program   Walk 5-15 minutes each day, 7 days per week.    Slowly increase until you are walking 30-45 minutes per day   Consider joining our BELT program. [phone] or email belt@[employer] .edu Things to remember:    You may have sexual relations when you feel comfortable. It is VERY important for female patients to use a reliable birth control method. Fertility often increases after surgery. Do not get pregnant for at least 18 months.   It is very important to keep all follow up appointments with your surgeon, nutritionist, primary care physician, and  behavioral health practitioner. After the first year, please follow up with your bariatric surgeon at least once a year in order to maintain best weight loss results.  [facility]: [phone] [facility]: [phone]   Free counseling is available for you and your family through collaboration between [facility] and [place]. Please call [phone] and leave a message.    Consider purchasing a medical alert bracelet that says you had gastric bypass surgery.    [facility] has a free Bariatric Surgery Support Group that meets monthly, the 3rd Thursday, 6 pm, Classroom #1, [place]. You may register online at [email or web address], but registration is not necessary. Select Classes and Support Groups, Bariatric Surgery, or Call [phone]   Do not return to work or drive until cleared by your surgeon   Use your CPAP when sleeping if applicable   Do not lift anything greater than ten pounds for at least two weeks

## 2012-10-18 NOTE — Plan of Care (Signed)
Problem: Phase II Progression Outcomes Goal: Tolerating diet advance to Outcome: Completed/Met Date Met:  10/18/12 POD #2 diet.  Tolerated protein shakes well.

## 2012-10-18 NOTE — Progress Notes (Signed)
Patient discharged per MD order. Discharge instructions thoroughly reviewed with patient and two prescriptions given to her. Pt wheeled down to the car via wheelchair by this RN. Angelena Form, RN

## 2012-10-18 NOTE — Plan of Care (Signed)
Problem: Phase II Progression Outcomes Goal: Activity at appropriate level-compared to baseline (UP IN CHAIR FOR HEMODIALYSIS)  Outcome: Completed/Met Date Met:  10/18/12 Patient ambulating in hallways without assistance.

## 2012-10-24 ENCOUNTER — Encounter (INDEPENDENT_AMBULATORY_CARE_PROVIDER_SITE_OTHER): Payer: Self-pay

## 2012-10-31 ENCOUNTER — Encounter: Payer: BC Managed Care – PPO | Attending: General Surgery | Admitting: *Deleted

## 2012-10-31 VITALS — Ht 66.0 in | Wt 251.0 lb

## 2012-10-31 DIAGNOSIS — Z713 Dietary counseling and surveillance: Secondary | ICD-10-CM | POA: Insufficient documentation

## 2012-10-31 DIAGNOSIS — Z01818 Encounter for other preprocedural examination: Secondary | ICD-10-CM | POA: Insufficient documentation

## 2012-11-02 ENCOUNTER — Encounter: Payer: Self-pay | Admitting: *Deleted

## 2012-11-02 NOTE — Progress Notes (Signed)
Bariatric Class:  Appt start time: 1600 end time:  1700.  2 Week Post-Operative Nutrition Class  Patient was seen on 10/31/12 for Post[facility]ative [facility]   Surgery date: 10/16/12  Surgery type: RYGB  Start weight at [facility]: 269.9 lbs (08/09/12)   Last weight: 269.7 lbs Weight today: 251.0 lbs Weight change: 18.7 lbs Total weight lost: 18.9 lbs  Goal weight: 150 lbs  % goal met: 16%  TANITA BODY COMP RESULTS   08/09/12  10/31/12  BMI (kg/m^2)  43.2  40.5  Fat Mass (lbs)  144.0  133.5  Fat Free Mass (lbs)  125.0  117.5  Total Body Water (lbs)  91.5  86.0   The following the learning objectives were met by the patient during this course:  Identifies Phase 3A (Soft, High Proteins) Dietary Goals and will begin from 2 weeks post-operatively to 2 months post-operatively  Identifies appropriate sources of fluids and proteins   States protein recommendations and appropriate sources post-operatively  Identifies the need for appropriate texture modifications, mastication, and bite sizes when consuming solids  Identifies appropriate multivitamin and calcium sources post-operatively  Describes the need for physical activity post-operatively and will follow MD recommendations  States when to call healthcare provider regarding medication questions or post-operative complications  Handouts given during class include:  Phase 3A: Soft, High Protein Diet Handout  Band Fill Guidelines Handout  Follow-Up Plan: Patient will follow-up at [facility] in 6 weeks for 2 months post-op nutrition visit for diet advancement per MD.

## 2012-11-02 NOTE — Patient Instructions (Signed)
Patient to follow Phase 3A-Soft, High Protein Diet and follow-up at [facility] in 6 weeks for 2 months post-op nutrition visit for diet advancement. 

## 2012-11-03 ENCOUNTER — Encounter (INDEPENDENT_AMBULATORY_CARE_PROVIDER_SITE_OTHER): Payer: Self-pay | Admitting: General Surgery

## 2012-11-03 ENCOUNTER — Ambulatory Visit (INDEPENDENT_AMBULATORY_CARE_PROVIDER_SITE_OTHER): Payer: BC Managed Care – PPO | Admitting: General Surgery

## 2012-11-03 VITALS — BP 128/76 | HR 76 | Temp 98.0°F | Resp 18 | Ht 66.0 in | Wt 250.2 lb

## 2012-11-03 DIAGNOSIS — Z09 Encounter for follow-up examination after completed treatment for conditions other than malignant neoplasm: Secondary | ICD-10-CM

## 2012-11-03 NOTE — Patient Instructions (Signed)
Keep up the good work Start doing interval walking like we discussed

## 2012-11-03 NOTE — Progress Notes (Signed)
Subjective:     Patient ID: Sheila Rodriguez, female   DOB: 1976-01-18, 37 y.o.   MRN: [id number]  HPI 37 year old Caucasian female comes in today for her first postoperative appointment after undergoing laparoscopic Roux-en-Y gastric bypass on 10/16/2012. She was discharged home on January 22. She states that she has been well since surgery. She states that she still having some fatigue issues. She states that her energy level hasn't really bounced back to normal yet. She states that she has noticed an improvement in her energy however since she started taking solid proteins the other day after meeting with the nutritionist. She states that the solid proteins have gone down without difficulty. She denies any abdominal pain. She denies any nausea, vomiting, regurgitation. She denies any diarrhea or constipation. She reports that she has had some itching around her incisions. She is planning to go back to work mid next week. So far for exercise she has walked on a treadmill several times.  Review of Systems     Objective:   Physical Exam BP 128/76  Pulse 76  Temp 98 F (36.7 C) (Temporal)  Resp 18  Ht 5\' 6"  (1.676 m)  Wt 250 lb 4 oz (113.513 kg)  BMI 40.39 kg/m2  LMP 10/05/2012  Gen: alert, NAD, non-toxic appearing Pupils: equal, no scleral icterus Pulm: Lungs clear to auscultation, symmetric chest rise CV: regular rate and rhythm Abd: soft, nontender, nondistended. Well-healed trocar sites.  No incisional hernia. Has skin irritation around all trocar sites c/w contact dermatitis Ext: no edema, no calf tenderness Skin: no rash, no jaundice     Assessment:     S/p Laparoscopic Roux-en-Y gastric bypass Contact dermatitis secondary to Steri-Strips    Plan:     I think she is doing quite well. She has lost about 13.6 pounds since her preoperative visit. She has lost about 21 pounds since her initial visit to my office. She appears to have developed contact dermatitis as a result of  her Steri-Strips. We will add this to her allergy list. I advised her she could try some over-the-counter hydrocortisone ointment. I encouraged her to continue taking her daily multivitamins. We discussed the importance of increasing her exercise frequency. We discussed interval walking. She was given a note to return to work next week. Followup with me in 2-3 weeks  Mary Sella. Andrey Campanile, MD, FACS General, Bariatric, & Minimally Invasive Surgery [facility], [place]

## 2012-11-11 ENCOUNTER — Other Ambulatory Visit: Payer: Self-pay

## 2012-11-23 ENCOUNTER — Ambulatory Visit (INDEPENDENT_AMBULATORY_CARE_PROVIDER_SITE_OTHER): Payer: BC Managed Care – PPO | Admitting: General Surgery

## 2012-11-23 ENCOUNTER — Encounter (INDEPENDENT_AMBULATORY_CARE_PROVIDER_SITE_OTHER): Payer: Self-pay | Admitting: General Surgery

## 2012-11-23 VITALS — BP 116/82 | HR 92 | Resp 18 | Ht 66.0 in | Wt 237.0 lb

## 2012-11-23 DIAGNOSIS — Z09 Encounter for follow-up examination after completed treatment for conditions other than malignant neoplasm: Secondary | ICD-10-CM

## 2012-11-23 NOTE — Progress Notes (Signed)
Subjective:     Patient ID: Sheila Rodriguez, female   DOB: [birth year], 37 y.o.   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 She was discharged home on January 22. I last saw her on February 7.She states that she has been well since Her last visit. She states that her energy level is finally back to normal. She denies any abdominal pain. She denies any nausea, vomiting, regurgitation. She denies any diarrhea or constipation. She reports that the itching around her incisions Has resolved. She has returned back to work. Her current exercise includes walking down the 10 flights of stairs at work as well as she is also walking up several flights of stairs a day. She has had some constipation issues recently. She has been taking MiraLAX 2-3 times per week. She states that she probably is not drinking enough water. She is having a bowel movement about every other day. She states that she has had a little bit of difficulty with chicken as well as scrambled eggs. She admits that she may not be taking enough time chewing her food. She denies any reflux. She has found a calcium supplement that she likes. She is eating the calcium caramel and chocolate chews from [employer]  Review of Systems     Objective:   Physical Exam BP 116/82  Pulse 92  Resp 18  Ht 5\' 6"  (1.676 m)  Wt 237 lb (107.502 kg)  BMI 38.27 kg/m2  Gen: alert, NAD, non-toxic appearing Pupils: equal, no scleral icterus Pulm: Lungs clear to auscultation, symmetric chest rise CV: regular rate and rhythm Abd: soft, nontender, nondistended. Well-healed trocar sites.  No incisional hernia.  Ext: no edema, no calf tenderness Skin: no rash, no jaundice     Assessment:     S/p Laparoscopic Roux-en-Y gastric bypass    Plan:     I think she is doing quite well. She has lost about 13.6 pounds since her  last postop visit and 27 pounds since her immediate preop visit. She has lost about 34 pounds since her initial visit to my office. I encouraged her to continue taking her daily multivitamins. She was given a referral to the BELT program. She was also encouraged to increase the amount of water she is drinking on a daily basis to help with her constipation. We rediscussed proper eating technique. Followup in 3 months  Mary Sella. Andrey Campanile, MD, FACS General, Bariatric, & Minimally Invasive Surgery [facility], [place]

## 2012-11-23 NOTE — Patient Instructions (Signed)
Increase the amount of water you are drinking Follow-up with the BELT program Keep up with the exercise You are doing great!

## 2012-11-29 ENCOUNTER — Encounter (INDEPENDENT_AMBULATORY_CARE_PROVIDER_SITE_OTHER): Payer: Self-pay | Admitting: General Surgery

## 2012-12-12 ENCOUNTER — Encounter: Payer: BC Managed Care – PPO | Attending: General Surgery | Admitting: *Deleted

## 2012-12-12 ENCOUNTER — Encounter: Payer: Self-pay | Admitting: *Deleted

## 2012-12-12 VITALS — Ht 66.0 in | Wt 231.0 lb

## 2012-12-12 DIAGNOSIS — Z01818 Encounter for other preprocedural examination: Secondary | ICD-10-CM | POA: Insufficient documentation

## 2012-12-12 DIAGNOSIS — Z713 Dietary counseling and surveillance: Secondary | ICD-10-CM | POA: Insufficient documentation
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
## 2012-12-12 NOTE — Patient Instructions (Addendum)
Goals:  Follow Phase 3B: High Protein + Non-Starchy Vegetables  Eat 3-6 small meals/snacks, every 3-5 hrs  Increase lean protein foods to meet 60-80g goal  Aim for >30 min of physical activity daily -   GET BACK ON THE TREADMILL OR MY MONKEYS WILL COME GET YOU!! :)

## 2013-01-09 ENCOUNTER — Ambulatory Visit: Payer: BC Managed Care – PPO | Admitting: *Deleted

## 2013-01-23 ENCOUNTER — Encounter: Payer: BC Managed Care – PPO | Attending: General Surgery | Admitting: *Deleted

## 2013-01-23 ENCOUNTER — Encounter: Payer: Self-pay | Admitting: *Deleted

## 2013-01-23 DIAGNOSIS — Z713 Dietary counseling and surveillance: Secondary | ICD-10-CM | POA: Insufficient documentation

## 2013-01-23 DIAGNOSIS — Z01818 Encounter for other preprocedural examination: Secondary | ICD-10-CM | POA: Insufficient documentation

## 2013-01-23 NOTE — Patient Instructions (Signed)
Goals:  Follow Phase 3B: High Protein + Non-Starchy Vegetables  Eat 3-6 small meals/snacks, every 3-5 hrs  Increase lean protein foods to meet 60-80g goal  Increase fluid intake to 64oz +  Add 15 grams of carbohydrate (fruit, whole grains) with meals  Always have protein with carbs  Avoid drinking 15 minutes before, during and 30 minutes after eating  Aim for >30 min of physical activity daily   TANITA BODY COMP RESULTS   08/09/12  10/31/12 12/12/12 01/23/13  BMI (kg/m^2)  43.2  40.5 37.3 34.4  Fat Mass (lbs)  144.0  133.5 113.5 98.5  Fat Free Mass (lbs)  125.0  117.5 117.5 114.5  Total Body Water (lbs)  91.5  86.0 86.0 84.0

## 2013-01-23 NOTE — Progress Notes (Signed)
  Follow-up visit:  12 Weeks Post-Operative RYGB Surgery  Medical Nutrition Therapy:  Appt start time:  830   End time:  900.  Primary concerns today: Post-operative Bariatric Surgery Nutrition Management. Sheila Rodriguez reports having few food choices last week while on a school trip with son in [place]. Reports she did the best she could with choices given. Doing extremely well otherwise.  Has lost 56.9 lbs (45.5 lbs of FAT MASS!!) so far.   Surgery date: 10/16/12  Surgery type: RYGB  Start weight at [facility]: 269.9 lbs (08/09/12)   Weight today: 213.0 lbs Weight change: 18.0 lbs Total weight lost: 56.9 lbs  Goal weight: 150 lbs  % goal met: 47%  TANITA BODY COMP RESULTS   08/09/12  10/31/12 12/12/12 01/23/13  BMI (kg/m^2)  43.2  40.5 37.3 34.4  Fat Mass (lbs)  144.0  133.5 113.5 98.5  Fat Free Mass (lbs)  125.0  117.5 117.5 114.5  Total Body Water (lbs)  91.5  86.0 86.0 84.0   Fluid intake:  45-65 oz = water, diet green tea Estimated total protein intake: 50-70g  Medications: See med list Supplementation: Taking regularly  Using straws: No Drinking while eating: No Hair loss: No Carbonated beverages: No N/V/D/C: Constipation only occasionally now; uses Miralax prn Dumping syndrome: No  Recent physical activity:  Continues to walk stairs at work, parks far away in parking lots, etc. Increased walking on trip with son.   Progress Towards Goal(s):  In progress.   Nutritional Diagnosis:  [place]-3.3 Overweight/obesity related to past poor dietary habits and physical inactivity as evidenced by patient w/ recent RYGB surgery following dietary guidelines for continued weight loss.    Intervention:  Nutrition education.  Monitoring/Evaluation:  Dietary intake, exercise, and body weight. Follow up in 3 months for 6 month post-op visit.

## 2013-02-15 ENCOUNTER — Ambulatory Visit (INDEPENDENT_AMBULATORY_CARE_PROVIDER_SITE_OTHER): Payer: BC Managed Care – PPO | Admitting: General Surgery

## 2013-02-15 ENCOUNTER — Encounter (INDEPENDENT_AMBULATORY_CARE_PROVIDER_SITE_OTHER): Payer: Self-pay | Admitting: General Surgery

## 2013-02-15 VITALS — BP 114/80 | HR 72 | Temp 98.9°F | Resp 14 | Ht 66.0 in | Wt 203.4 lb

## 2013-02-15 DIAGNOSIS — Z9884 Bariatric surgery status: Secondary | ICD-10-CM | POA: Insufficient documentation

## 2013-02-15 NOTE — Patient Instructions (Signed)
Keep up the great work! Try working on your daily water intake Take miralax daily Consider getting a pedometer and tracking your daily step count

## 2013-02-15 NOTE — Progress Notes (Signed)
Subjective:     Patient ID: Sheila Rodriguez, female   DOB: 1976-07-30, 37 y.o.   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 I last saw in the office on 11/23/2012. At that time she states that she's been doing really well. She is no longer constipated. She is having bowel movements on a daily basis. However she is still having hard stools and occasionally a lot of pain which has a bowel movement. She admits that she is probably not drinking her 64 ounces of water on a daily basis. She is taking MiraLAX as needed. She denies any heartburn. She denies any regurgitation. She denies any dumping. She denies any hair loss. She denies any paresthesias. She reports an excellent energy level. She is now walking upstairs. She is probably walking 20-30 minutes at a time 2-3 times a week. But she is now parking further away from store entrances. She reports that her triglyceride level was improved on in employee health screening performed about a month ago  PMHx, PSHx, SOCHx, FAMHx, ALL reviewed and unchanged   Review of Systems 10 point review of systems was performed and all systems are negative except for what is mentioned in history of present illness    Objective:   Physical Exam BP 114/80  Pulse 72  Temp(Src) 98.9 F (37.2 C) (Temporal)  Resp 14  Ht 5\' 6"  (1.676 m)  Wt 203 lb 6.4 oz (92.262 kg)  BMI 32.85 kg/m2  Gen: alert, NAD, non-toxic appearing Pupils: equal, no scleral icterus Pulm: Lungs clear to auscultation, symmetric chest rise CV: regular rate and rhythm Abd: soft, nontender, nondistended. Well-healed trocar sites. No cellulitis. No incisional hernia Ext: no edema, no calf tenderness Skin: no rash, no jaundice     Assessment:     S/p LRYGB 10/16/12 GERD - resolved     Plan:     Overall she is doing great. I congratulated her on her weight. Her preoperative BMI was 43. Her BMI  today is 32.8. Her preoperative weight was 271 pounds. Her weight today is 203.4 pounds.  We discussed the importance of increasing her water intake in order to help with her hard stools. I also encouraged her to start taking MiraLAX daily basis. We also discussed using sitz baths when she does have a painful bowel movement.  We discussed the risks of establishing a regular exercise routine. We discussed using a pedometer. She is taking her multivitamin on a daily basis as well as her supplements.  Followup 3 months  Mary Sella. Andrey Campanile, MD, FACS General, Bariatric, & Minimally Invasive Surgery [facility], [place]

## 2013-04-17 ENCOUNTER — Other Ambulatory Visit (INDEPENDENT_AMBULATORY_CARE_PROVIDER_SITE_OTHER): Payer: Self-pay | Admitting: General Surgery

## 2013-04-24 ENCOUNTER — Encounter: Payer: BC Managed Care – PPO | Attending: General Surgery | Admitting: *Deleted

## 2013-04-24 ENCOUNTER — Encounter: Payer: Self-pay | Admitting: *Deleted

## 2013-04-24 NOTE — Patient Instructions (Addendum)
Goals:  Follow Phase 3B: High Protein + Non-Starchy Vegetables  Increase lean protein foods to meet 60-80g goal  Increase fluid intake to 64oz +  Aim for 15 grams of carbohydrate (fruit, whole grains) with meals  Always have protein with carbs  Try adding biotin to help with hair loss  Aim for >30 min of physical activity daily  TANITA BODY COMP RESULTS   08/09/12  10/31/12 12/12/12 01/23/13 04/24/13  BMI (kg/m^2)  43.2  40.5 37.3 34.4 30.7  Fat Mass (lbs)  144.0  133.5 113.5 98.5 81.0  Fat Free Mass (lbs)  125.0  117.5 117.5 114.5 3.0  Total Body Water (lbs)  91.5  86.0 86.0 84.0 2.0

## 2013-04-24 NOTE — Progress Notes (Signed)
  Follow-up visit:  6 Month Post-Operative RYGB Surgery  Medical Nutrition Therapy:  Appt start time:  800   End time:  830.  Primary concerns today: Post-operative Bariatric Surgery Nutrition Management. Candi returns with an additional 22.5 lb wt loss. Doing extremely well with no problems reported. Has added more CHO into diet. Excessive at times via medium bananas, etc.  Not exercising at this time, though signed up for a 5K in August '14. Plans to walk.    Surgery date: 10/16/12  Surgery type: RYGB  Start weight at [facility]: 269.9 lbs (08/09/12)   Weight today: 190.5 lbs Weight change: 22.5 lbs Total weight lost: 79.4 lbs  Goal weight: 150 lbs  % goal met: 66%  TANITA BODY COMP RESULTS   08/09/12  10/31/12 12/12/12 01/23/13 04/24/13  BMI (kg/m^2)  43.2  40.5 37.3 34.4 30.7  Fat Mass (lbs)  144.0  133.5 113.5 98.5 81.0  Fat Free Mass (lbs)  125.0  117.5 117.5 114.5 3.0  Total Body Water (lbs)  91.5  86.0 86.0 84.0 2.0   24 hour recall  B: 1 egg w/ cheese (1 oz) (On 2 days/wk has hashbrowns also) OR 1 pc Ezekiel bread w/ jelly S: String cheese OR .9 oz almonds L: 3 oz chicken breast, salad S: NONE D: 3 oz pork, 1/4 c green peas, <1/4 c pasta  S: 3x/wk - 4 triscuits w/ goat cheese  Fluid intake:  60-65 oz = water, diet green tea (16.9 oz) Estimated total protein intake: 50-70g  Medications: No changes Supplementation: Taking regularly  Using straws: No Drinking while eating: No Hair loss:  Yes; discussed adding biotin  Carbonated beverages: No N/V/D/C: Constipation resolved. Feels nausea if eating sweets (even small amounts), but no dumping  Dumping syndrome: No  Recent physical activity:  Continues to walk stairs at work, parks far away in parking lots, etc. Signed up for [employer] 5 K in August.   Progress Towards Goal(s):  In progress.   Nutritional Diagnosis:  [place]-3.3 Overweight/obesity related to past poor dietary habits and physical inactivity as evidenced by patient  w/ recent RYGB surgery following dietary guidelines for continued weight loss.    Intervention:  Nutrition education.  Monitoring/Evaluation:  Dietary intake, exercise, and body weight. Follow up in 6 months for 12 month post-op visit.

## 2013-05-10 ENCOUNTER — Telehealth (INDEPENDENT_AMBULATORY_CARE_PROVIDER_SITE_OTHER): Payer: Self-pay | Admitting: *Deleted

## 2013-05-10 NOTE — Telephone Encounter (Signed)
Patient called this morning stating beginning Monday she has had 2 sites of abdominal pain, one being on the right side under her rib cage and the second to the left of her belly button.  Patient reports some issues with nausea but states she thinks that's coming from some new vitamins she is taking.  Patient denies fever or vomiting.  Patient reports her bowel movements have slowed down to every other day and they have been firm.  Patient reports no difficulty with eating and no change in symptoms with eating.  Patient states on Monday when she had a bowel movement she had some relief of the pain but has not with since BMs.  Patient reports issues with constipation in the past.  Suggested to patient per protocol to try OTC Miralax or Milk of Mag which ever her preference for the next 24 hours to see if that will help to relieve the pain.  Also suggested to patient to increase fluids to help to soften stools.  Instructed patient to call back tomorrow if symptoms have not resolved.  Patient states understanding and agreeable with plan at this time.

## 2013-05-23 ENCOUNTER — Encounter (INDEPENDENT_AMBULATORY_CARE_PROVIDER_SITE_OTHER): Payer: Self-pay | Admitting: General Surgery

## 2013-05-23 ENCOUNTER — Ambulatory Visit (INDEPENDENT_AMBULATORY_CARE_PROVIDER_SITE_OTHER): Payer: BC Managed Care – PPO | Admitting: General Surgery

## 2013-05-23 VITALS — BP 106/78 | HR 92 | Temp 98.4°F | Resp 16 | Ht 66.0 in | Wt 183.4 lb

## 2013-05-23 DIAGNOSIS — Z9884 Bariatric surgery status: Secondary | ICD-10-CM

## 2013-05-23 NOTE — Patient Instructions (Addendum)
Keep up the good work! Look into your protein intake Try taking Biotin for hair loss You can take colace daily. Use Miralax as needed when you don't have a BM for more than 2 days Exercise regularly Get your labs drawn

## 2013-05-24 ENCOUNTER — Other Ambulatory Visit (INDEPENDENT_AMBULATORY_CARE_PROVIDER_SITE_OTHER): Payer: Self-pay | Admitting: General Surgery

## 2013-05-24 LAB — CBC WITH DIFFERENTIAL/PLATELET
Eosinophils Relative: 2 % (ref 0–5)
HCT: 38.5 % (ref 36.0–46.0)
Lymphocytes Relative: 35 % (ref 12–46)
Lymphs Abs: 2.1 10*3/uL (ref 0.7–4.0)
MCV: 85.4 fL (ref 78.0–100.0)
Monocytes Absolute: 0.4 10*3/uL (ref 0.1–1.0)
RBC: 4.51 MIL/uL (ref 3.87–5.11)
WBC: 6.2 10*3/uL (ref 4.0–10.5)

## 2013-05-24 NOTE — Progress Notes (Signed)
Subjective:     Patient ID: Sheila Rodriguez, female   DOB: 05-Apr-1976, 37 y.o.   MRN: [id number]  HPI 37 year old Caucasian female comes in for followup after undergoing laparoscopic Roux-en-Y gastric bypass surgery on 10/16/2012. I last saw her in the office on May 22. At that time her weight was 203.4 pounds. Today her weight is 183.4 pounds. She states that she is feeling great. She states that since she was last seen she has completed a 5K. She is also trying to reach 10,000 steps daily. She is also using the treadmill. She is now size 12. She states her only significant issue is some constipation. Her bowel movements are firm. She has 3-4 bowel movements a week. She denies any melena or hematochezia. She states that Colace and MiraLAX helps. She states that she is taking her supplements. She denies any paresthesias, nausea, vomiting, heartburn. She does report some hair thinning. She denies any abdominal pain. She denies any dumping.  PMHx, PSHx, SOCHx, FAMHx, ALL reviewed and unchanged  Review of Systems 10 point review of systems is performed and all systems are negative except what is mentioned above    Objective:   Physical Exam BP 106/78  Pulse 92  Temp(Src) 98.4 F (36.9 C) (Temporal)  Resp 16  Ht 5\' 6"  (1.676 m)  Wt 183 lb 6.4 oz (83.19 kg)  BMI 29.62 kg/m2  SpO2 98%  Gen: alert, NAD, non-toxic appearing Pupils: equal, no scleral icterus Pulm: Lungs clear to auscultation, symmetric chest rise CV: regular rate and rhythm Abd: soft, nontender, nondistended. Well-healed trocar sites. No cellulitis. No incisional hernia Ext: no edema, no calf tenderness Skin: no rash, no jaundice     Assessment:     Status post laparoscopic Roux-en-Y gastric bypass 10/16/2012 Gastroesophageal reflux disease-resolved     Plan:     I congratulated her on her weight loss. She has lost an additional 20 pounds since I last saw her. Her total weight loss has been 87.6 pounds. I  congratulated her on her weight loss. I am thrilled that she is able to do a 5K. She is excited that she is down to a size 12.  I told her to continue the MiraLAX and Colace as needed. She was encouraged to continue with regular exercise. She is also instructed to stay compliant with her supplements.  She is due for routine lab surveillance. If her labs are normal then we will plan to see her at the beginning of the year for her first year followup.   Mary Sella. Andrey Campanile, MD, FACS General, Bariatric, & Minimally Invasive Surgery [facility], [place]

## 2013-05-25 LAB — CMP AND LIVER
Albumin: 4.3 g/dL (ref 3.5–5.2)
BUN: 11 mg/dL (ref 6–23)
Bilirubin, Direct: 0.1 mg/dL (ref 0.0–0.3)
CO2: 28 mEq/L (ref 19–32)
Glucose, Bld: 79 mg/dL (ref 70–99)
Indirect Bilirubin: 0.3 mg/dL (ref 0.0–0.9)
Sodium: 139 mEq/L (ref 135–145)
Total Bilirubin: 0.4 mg/dL (ref 0.3–1.2)
Total Protein: 6.7 g/dL (ref 6.0–8.3)

## 2013-05-25 LAB — IBC PANEL
%SAT: 25 % (ref 20–55)
TIBC: 365 ug/dL (ref 250–470)

## 2013-05-25 LAB — MAGNESIUM: Magnesium: 2 mg/dL (ref 1.5–2.5)

## 2013-05-25 LAB — VITAMIN B12: Vitamin B-12: 894 pg/mL (ref 211–911)

## 2013-05-25 LAB — VITAMIN D 25 HYDROXY (VIT D DEFICIENCY, FRACTURES): Vit D, 25-Hydroxy: 42 ng/mL (ref 30–89)

## 2013-05-25 LAB — LIPID PANEL: HDL: 46 mg/dL (ref >39)

## 2013-08-02 ENCOUNTER — Other Ambulatory Visit: Payer: Self-pay

## 2013-09-12 ENCOUNTER — Encounter (INDEPENDENT_AMBULATORY_CARE_PROVIDER_SITE_OTHER): Payer: Self-pay | Admitting: General Surgery

## 2013-10-18 ENCOUNTER — Encounter (INDEPENDENT_AMBULATORY_CARE_PROVIDER_SITE_OTHER): Payer: Self-pay | Admitting: General Surgery

## 2013-10-18 ENCOUNTER — Ambulatory Visit (INDEPENDENT_AMBULATORY_CARE_PROVIDER_SITE_OTHER): Payer: BC Managed Care – PPO | Admitting: General Surgery

## 2013-10-18 VITALS — BP 122/84 | HR 84 | Temp 97.4°F | Resp 14 | Ht 66.0 in | Wt 158.6 lb

## 2013-10-18 DIAGNOSIS — Z9884 Bariatric surgery status: Secondary | ICD-10-CM

## 2013-10-18 NOTE — Patient Instructions (Signed)
Get your labs drawn please Get active again - start small - start wearing your pedometer again! Need to exercise daily

## 2013-10-18 NOTE — Progress Notes (Signed)
Patient ID: Sheila Rodriguez, female   DOB: [birth year], 38 y.o.   MRN: [id number]  Chief Complaint  Patient presents with  . Bariatric Follow Up    LTFU yearly reck rny sx 1.20.14    HPI Sheila Rodriguez is a 38 y.o. female.  HPI 38 year old Caucasian female comes in for her annual followup. She underwent laparoscopic Roux-en-Y gastric bypass surgery on 10/16/2012. I last saw her in the office on August 2014. Her weight on August 27 was 183.6 pounds. She states that she has been doing well since her last visit. She did develop an upper respiratory cough and mucousy drainage in early November. Other than that she denies any changes. She admits that she has fallen off the wagonWith respect to exercise. She has not been wearing her pedometer.She denies any abdominal pain. She reports good bowel movements. She states that she is taking her supplements as directed. Past Medical History  Diagnosis Date  . Depression   . Migraines   . Morbid obesity   . Hyperlipidemia     TG - 178 (07/2012) per pt  . GERD (gastroesophageal reflux disease)     Past Surgical History  Procedure Laterality Date  . Laparoscopic cholecystectomy  2000  . Tubal ligation  07/2008  . Gastric roux-en-y  10/16/2012    Procedure: LAPAROSCOPIC ROUX-EN-Y GASTRIC;  Surgeon: Atilano InaEric M Annalena Piatt, MD,FACS;  Location: [facility]S;  Service: General;  Laterality: N/A;  . Upper gi endoscopy  10/16/2012    Procedure: UPPER GI ENDOSCOPY;  Surgeon: Atilano InaEric M Ciella Obi, MD,FACS;  Location: [facility];  Service: General;;    Family History  Problem Relation Age of Onset  . Breast cancer Mother 1841  . Cancer Mother     breast  . Colon cancer Paternal Aunt   . Cancer Paternal Aunt     colon  . Bladder Cancer Paternal Aunt   . Cancer Paternal Aunt     colon  . Kidney cancer Maternal Uncle   . Heart disease Father     Social History History  Substance Use Topics  . Smoking status: Former Smoker    Quit date: 12/27/2003  . Smokeless tobacco:  Never Used  . Alcohol Use: Yes     Comment: once a month    No Known Allergies  Current Outpatient Prescriptions  Medication Sig Dispense Refill  . acetaminophen (TYLENOL) 500 MG tablet Take 500 mg by mouth every 6 (six) hours as needed.      Marland Kitchen. buPROPion (WELLBUTRIN) 100 MG tablet TAKE 1 TABLET BY MOUTH 3 TIMES A DAY  90 tablet  3  . FLUoxetine (PROZAC) 20 MG capsule Take 20 mg by mouth daily.        No current facility-administered medications for this visit.    Review of Systems Review of Systems  Constitutional: Negative for fever, activity change, appetite change and unexpected weight change.  HENT: Negative for nosebleeds and trouble swallowing.        Mild hair loss - much improved since last visit - increased protein  Eyes: Negative for photophobia and visual disturbance.  Respiratory: Negative for chest tightness and shortness of breath.   Cardiovascular: Negative for chest pain and leg swelling.       Denies CP, SOB, orthopnea, PND, DOE  Genitourinary: Negative for dysuria and difficulty urinating.  Musculoskeletal: Negative for arthralgias.  Skin: Negative for pallor and rash.  Neurological: Negative for dizziness, seizures, facial asymmetry and numbness.       Denies  TIA and amaurosis fugax   Hematological: Negative for adenopathy. Does not bruise/bleed easily.  Psychiatric/Behavioral: Negative for behavioral problems and agitation.    Blood pressure 122/84, pulse 84, temperature 97.4 F (36.3 C), temperature source Temporal, resp. rate 14, height 5\' 6"  (1.676 m), weight 158 lb 9.6 oz (71.94 kg).  Physical Exam Physical Exam  Vitals reviewed. Constitutional: She is oriented to person, place, and time. She appears well-developed and well-nourished. No distress.  HENT:  Head: Normocephalic and atraumatic.  Right Ear: External ear normal.  Left Ear: External ear normal.  Eyes: Conjunctivae are normal. No scleral icterus.  Neck: Normal range of motion. Neck  supple. No tracheal deviation present. No thyromegaly present.  Cardiovascular: Normal rate and normal heart sounds.   Pulmonary/Chest: Effort normal and breath sounds normal. No stridor. No respiratory distress. She has no wheezes.  Abdominal: Soft. She exhibits no distension. There is no tenderness. There is no rebound.  Well-healed trocar incisions  Musculoskeletal: She exhibits no edema and no tenderness.  Lymphadenopathy:    She has no cervical adenopathy.  Neurological: She is alert and oriented to person, place, and time. She exhibits normal muscle tone.  Skin: Skin is warm and dry. No rash noted. She is not diaphoretic. No erythema.  Psychiatric: She has a normal mood and affect. Her behavior is normal. Judgment and thought content normal.    Data Reviewed My office note  Assessment    Status post laparoscopic Roux-en-Y gastric bypass in 10/16/2012 Gastroesophageal reflux disease-resolved     Plan    I congratulated her on her weight loss. Today she has lost approximately 112 pounds. She is no longer considered overweight. We did discuss the importance of routine frequent exercise. We discussed that this is important in order to prevent weight regain. I encouraged her start little such as starting to wear her pedometer again. We discussed the importance of ongoing food choices. Otherwise she is doing great. She is due for routine lab surveillance. Her labs that were checked in August were normal. Followup in one year unless lab abnormality  Mary Sella. Andrey Campanile, MD, FACS General, Bariatric, & Minimally Invasive Surgery [facility], [place]         [facility] M 10/18/2013, 4:07 PM

## 2013-10-23 ENCOUNTER — Encounter (INDEPENDENT_AMBULATORY_CARE_PROVIDER_SITE_OTHER): Payer: Self-pay | Admitting: General Surgery

## 2014-05-09 ENCOUNTER — Encounter (INDEPENDENT_AMBULATORY_CARE_PROVIDER_SITE_OTHER): Payer: Self-pay | Admitting: General Surgery

## 2014-05-28 ENCOUNTER — Telehealth (INDEPENDENT_AMBULATORY_CARE_PROVIDER_SITE_OTHER): Payer: Self-pay

## 2014-05-28 NOTE — Telephone Encounter (Signed)
Called pt after I responded to her message in my chart. I advised pt that Dr Andrey Campanile did review the chart and he said the yearly bariatric labs are past due. I placed the yearly lab orders in epic for the pt to go to [place] [facility] at [employer]. Pt will go get the labs drawn and we will notify her of the results to see about her hgb. Pt understands.

## 2014-05-31 ENCOUNTER — Other Ambulatory Visit (INDEPENDENT_AMBULATORY_CARE_PROVIDER_SITE_OTHER): Payer: Self-pay | Admitting: General Surgery

## 2014-05-31 LAB — CMP AND LIVER
ALBUMIN: 4.3 g/dL (ref 3.5–5.2)
ALK PHOS: 74 U/L (ref 39–117)
ALT: 10 U/L (ref 0–35)
AST: 13 U/L (ref 0–37)
BILIRUBIN DIRECT: 0.1 mg/dL (ref 0.0–0.3)
BILIRUBIN INDIRECT: 0.2 mg/dL (ref 0.2–1.2)
BUN: 10 mg/dL (ref 6–23)
CALCIUM: 9.2 mg/dL (ref 8.4–10.5)
CHLORIDE: 100 meq/L (ref 96–112)
CO2: 26 meq/L (ref 19–32)
Creat: 0.84 mg/dL (ref 0.50–1.10)
GLUCOSE: 165 mg/dL — AB (ref 70–99)
POTASSIUM: 3.8 meq/L (ref 3.5–5.3)
SODIUM: 135 meq/L (ref 135–145)
TOTAL PROTEIN: 6.6 g/dL (ref 6.0–8.3)
Total Bilirubin: 0.3 mg/dL (ref 0.2–1.2)

## 2014-05-31 LAB — IRON AND TIBC
%SAT: 11 % — ABNORMAL LOW (ref 20–55)
Iron: 54 ug/dL (ref 42–145)
TIBC: 496 ug/dL — ABNORMAL HIGH (ref 250–470)
UIBC: 442 ug/dL — ABNORMAL HIGH (ref 125–400)

## 2014-05-31 LAB — CBC WITH DIFFERENTIAL/PLATELET
BASOS ABS: 0.1 10*3/uL (ref 0.0–0.1)
Basophils Relative: 1 % (ref 0–1)
EOS PCT: 3 % (ref 0–5)
Eosinophils Absolute: 0.2 10*3/uL (ref 0.0–0.7)
HCT: 33.8 % — ABNORMAL LOW (ref 36.0–46.0)
Hemoglobin: 10.7 g/dL — ABNORMAL LOW (ref 12.0–15.0)
LYMPHS ABS: 1.6 10*3/uL (ref 0.7–4.0)
LYMPHS PCT: 29 % (ref 12–46)
MCH: 22.7 pg — ABNORMAL LOW (ref 26.0–34.0)
MCHC: 31.7 g/dL (ref 30.0–36.0)
MCV: 71.8 fL — AB (ref 78.0–100.0)
Monocytes Absolute: 0.3 10*3/uL (ref 0.1–1.0)
Monocytes Relative: 5 % (ref 3–12)
NEUTROS PCT: 62 % (ref 43–77)
Neutro Abs: 3.4 10*3/uL (ref 1.7–7.7)
PLATELETS: 341 10*3/uL (ref 150–400)
RBC: 4.71 MIL/uL (ref 3.87–5.11)
RDW: 16.4 % — AB (ref 11.5–15.5)
WBC: 5.5 10*3/uL (ref 4.0–10.5)

## 2014-05-31 LAB — VITAMIN B12: VITAMIN B 12: 457 pg/mL (ref 211–911)

## 2014-05-31 LAB — FOLATE

## 2014-06-01 LAB — VITAMIN D 25 HYDROXY (VIT D DEFICIENCY, FRACTURES): VIT D 25 HYDROXY: 37 ng/mL (ref 30–89)

## 2014-06-17 NOTE — Telephone Encounter (Signed)
Pt will need a ltfu appointment with Dr. Andrey Campanile in January 2016.  CBC and Iron panel will need to be drawn in December.  LM on pt's VM to call for instructions.  Order in [place].

## 2014-06-18 ENCOUNTER — Other Ambulatory Visit (INDEPENDENT_AMBULATORY_CARE_PROVIDER_SITE_OTHER): Payer: Self-pay | Admitting: General Surgery

## 2015-02-27 ENCOUNTER — Other Ambulatory Visit: Payer: Self-pay

## 2015-02-27 ENCOUNTER — Ambulatory Visit
Admission: RE | Admit: 2015-02-27 | Discharge: 2015-02-27 | Disposition: A | Discharge status: Home / Self Care | Payer: BLUE CROSS/BLUE SHIELD | Source: Ambulatory Visit | Attending: General Surgery | Admitting: General Surgery

## 2015-02-27 DIAGNOSIS — R042 Hemoptysis: Secondary | ICD-10-CM

## 2015-03-03 ENCOUNTER — Other Ambulatory Visit: Payer: Self-pay | Admitting: General Surgery

## 2015-03-03 DIAGNOSIS — R059 Cough, unspecified: Secondary | ICD-10-CM

## 2015-03-03 DIAGNOSIS — R05 Cough: Secondary | ICD-10-CM

## 2015-03-03 DIAGNOSIS — K219 Gastro-esophageal reflux disease without esophagitis: Secondary | ICD-10-CM

## 2015-03-04 ENCOUNTER — Other Ambulatory Visit: Payer: Self-pay | Admitting: General Surgery

## 2015-03-04 DIAGNOSIS — K219 Gastro-esophageal reflux disease without esophagitis: Secondary | ICD-10-CM

## 2015-03-04 DIAGNOSIS — R05 Cough: Secondary | ICD-10-CM

## 2015-03-04 DIAGNOSIS — R059 Cough, unspecified: Secondary | ICD-10-CM

## 2015-03-05 ENCOUNTER — Other Ambulatory Visit: Payer: Self-pay | Admitting: General Surgery

## 2015-04-01 ENCOUNTER — Ambulatory Visit
Admission: RE | Admit: 2015-04-01 | Discharge: 2015-04-01 | Disposition: A | Discharge status: Home / Self Care | Payer: BLUE CROSS/BLUE SHIELD | Source: Ambulatory Visit | Attending: General Surgery | Admitting: General Surgery

## 2015-04-01 ENCOUNTER — Other Ambulatory Visit: Payer: Self-pay | Admitting: General Surgery

## 2015-05-03 ENCOUNTER — Emergency Department (HOSPITAL_COMMUNITY)
Admission: EM | Admit: 2015-05-03 | Discharge: 2015-05-03 | Disposition: A | Discharge status: Home / Self Care | Payer: BLUE CROSS/BLUE SHIELD | Attending: Emergency Medicine | Admitting: Emergency Medicine

## 2015-05-03 ENCOUNTER — Encounter (HOSPITAL_COMMUNITY): Payer: Self-pay | Admitting: Emergency Medicine

## 2015-05-03 ENCOUNTER — Emergency Department (HOSPITAL_COMMUNITY): Payer: BLUE CROSS/BLUE SHIELD

## 2015-05-03 DIAGNOSIS — R1013 Epigastric pain: Secondary | ICD-10-CM

## 2015-05-03 DIAGNOSIS — Z9049 Acquired absence of other specified parts of digestive tract: Secondary | ICD-10-CM | POA: Insufficient documentation

## 2015-05-03 DIAGNOSIS — Z87891 Personal history of nicotine dependence: Secondary | ICD-10-CM | POA: Insufficient documentation

## 2015-05-03 DIAGNOSIS — Z79899 Other long term (current) drug therapy: Secondary | ICD-10-CM | POA: Diagnosis not present

## 2015-05-03 DIAGNOSIS — Z8669 Personal history of other diseases of the nervous system and sense organs: Secondary | ICD-10-CM | POA: Diagnosis not present

## 2015-05-03 DIAGNOSIS — Z8719 Personal history of other diseases of the digestive system: Secondary | ICD-10-CM | POA: Diagnosis not present

## 2015-05-03 DIAGNOSIS — F329 Major depressive disorder, single episode, unspecified: Secondary | ICD-10-CM | POA: Insufficient documentation

## 2015-05-03 LAB — URINALYSIS, ROUTINE W REFLEX MICROSCOPIC
Bilirubin Urine: NEGATIVE
Glucose, UA: NEGATIVE mg/dL
Ketones, ur: NEGATIVE mg/dL
Leukocytes, UA: NEGATIVE
Nitrite: NEGATIVE
Protein, ur: NEGATIVE mg/dL
Specific Gravity, Urine: 1.006 (ref 1.005–1.030)
Urobilinogen, UA: 0.2 mg/dL (ref 0.0–1.0)
pH: 7.5 (ref 5.0–8.0)

## 2015-05-03 LAB — CBC
HCT: 39.2 % (ref 36.0–46.0)
Hemoglobin: 12.8 g/dL (ref 12.0–15.0)
MCH: 26.9 pg (ref 26.0–34.0)
MCHC: 32.7 g/dL (ref 30.0–36.0)
MCV: 82.4 fL (ref 78.0–100.0)
Platelets: 361 10*3/uL (ref 150–400)
RBC: 4.76 MIL/uL (ref 3.87–5.11)
RDW: 14.8 % (ref 11.5–15.5)
WBC: 7.3 10*3/uL (ref 4.0–10.5)

## 2015-05-03 LAB — COMPREHENSIVE METABOLIC PANEL
ALT: 15 U/L (ref 14–54)
AST: 23 U/L (ref 15–41)
Albumin: 4.4 g/dL (ref 3.5–5.0)
Alkaline Phosphatase: 90 U/L (ref 38–126)
Anion gap: 8 (ref 5–15)
BUN: 7 mg/dL (ref 6–20)
CO2: 28 mmol/L (ref 22–32)
Calcium: 8.6 mg/dL — ABNORMAL LOW (ref 8.9–10.3)
Chloride: 102 mmol/L (ref 101–111)
Creatinine, Ser: 0.76 mg/dL (ref 0.44–1.00)
GFR calc Af Amer: >60 mL/min (ref >60)
GFR calc non Af Amer: >60 mL/min (ref >60)
Glucose, Bld: 88 mg/dL (ref 65–99)
Potassium: 3.9 mmol/L (ref 3.5–5.1)
Sodium: 138 mmol/L (ref 135–145)
Total Bilirubin: 0.2 mg/dL — ABNORMAL LOW (ref 0.3–1.2)
Total Protein: 7.7 g/dL (ref 6.5–8.1)

## 2015-05-03 LAB — URINE MICROSCOPIC-ADD ON

## 2015-05-03 LAB — I-STAT BETA HCG BLOOD, ED (MC, WL, AP ONLY): I-stat hCG, quantitative: <5 m[IU]/mL (ref <5)

## 2015-05-03 LAB — LIPASE, BLOOD: Lipase: 29 U/L (ref 22–51)

## 2015-05-03 MED ORDER — IOHEXOL 300 MG/ML  SOLN
50.0000 mL | Freq: Once | INTRAMUSCULAR | Status: AC | PRN
  Administered 2015-05-03: 50 mL via ORAL

## 2015-05-03 MED ORDER — IOHEXOL 300 MG/ML  SOLN
100.0000 mL | Freq: Once | INTRAMUSCULAR | Status: AC | PRN
  Administered 2015-05-03: 100 mL via INTRAVENOUS

## 2015-05-03 MED ORDER — SUCRALFATE 1 G PO TABS
1.0000 g | ORAL_TABLET | Freq: Once | ORAL | Status: AC
  Administered 2015-05-03: 1 g via ORAL
  Filled 2015-05-03: qty 1

## 2015-05-03 MED ORDER — FAMOTIDINE 20 MG PO TABS: 20.0000 mg | ORAL_TABLET | Freq: Two times a day (BID) | ORAL | Status: DC

## 2015-05-03 MED ORDER — SODIUM CHLORIDE 0.9 % IV SOLN
Freq: Once | INTRAVENOUS | Status: AC
  Administered 2015-05-03: 07:00:00 via INTRAVENOUS

## 2015-05-03 MED ORDER — PANTOPRAZOLE SODIUM 20 MG PO TBEC: 20.0000 mg | DELAYED_RELEASE_TABLET | Freq: Every day | ORAL | Status: DC

## 2015-05-03 MED ORDER — ONDANSETRON HCL 4 MG/2ML IJ SOLN
4.0000 mg | Freq: Once | INTRAMUSCULAR | Status: AC
  Administered 2015-05-03: 4 mg via INTRAVENOUS
  Filled 2015-05-03: qty 2

## 2015-05-03 MED ORDER — GI COCKTAIL ~~LOC~~
30.0000 mL | Freq: Once | ORAL | Status: AC
  Administered 2015-05-03: 30 mL via ORAL
  Filled 2015-05-03: qty 30

## 2015-05-03 MED ORDER — SUCRALFATE 1 G PO TABS: 1.0000 g | ORAL_TABLET | Freq: Three times a day (TID) | ORAL | Status: DC

## 2015-05-03 MED ORDER — HYDROMORPHONE HCL 1 MG/ML IJ SOLN
0.5000 mg | Freq: Once | INTRAMUSCULAR | Status: AC
  Administered 2015-05-03: 0.5 mg via INTRAVENOUS
  Filled 2015-05-03: qty 1

## 2015-05-03 MED ORDER — HYDROCODONE-ACETAMINOPHEN 5-325 MG PO TABS: 1.0000 | ORAL_TABLET | ORAL | Status: DC | PRN

## 2015-05-03 MED ORDER — MORPHINE SULFATE 4 MG/ML IJ SOLN
4.0000 mg | Freq: Once | INTRAMUSCULAR | Status: AC
  Administered 2015-05-03: 4 mg via INTRAVENOUS
  Filled 2015-05-03: qty 1

## 2015-05-03 NOTE — ED Notes (Signed)
Pt from home c/o upper abdominal pain. Denies nausea or vomiting. Hx of gastric bypass in 2014.

## 2015-05-03 NOTE — Discharge Instructions (Signed)
Start protonix daily. Take pepcid twice a day every day. carafate as prescribed as needed. Call and follow up with Dr. Andrey Campanile next week. Return if fever, persistent vomiting, any new concerning symptoms.   Peptic Ulcer A peptic ulcer is a sore in the lining of your esophagus (esophageal ulcer), stomach (gastric ulcer), or in the first part of your small intestine (duodenal ulcer). The ulcer causes erosion into the deeper tissue. CAUSES  Normally, the lining of the stomach and the small intestine protects itself from the acid that digests food. The protective lining can be damaged by:  An infection caused by a bacterium called Helicobacter pylori (H. pylori).  Regular use of nonsteroidal anti-inflammatory drugs (NSAIDs), such as ibuprofen or aspirin.  Smoking tobacco. Other risk factors include being older than 50, drinking alcohol excessively, and having a family history of ulcer disease.  SYMPTOMS   Burning pain or gnawing in the area between the chest and the belly button.  Heartburn.  Nausea and vomiting.  Bloating. The pain can be worse on an empty stomach and at night. If the ulcer results in bleeding, it can cause:  Black, tarry stools.  Vomiting of bright red blood.  Vomiting of coffee-ground-looking materials. DIAGNOSIS  A diagnosis is usually made based upon your history and an exam. Other tests and procedures may be performed to find the cause of the ulcer. Finding a cause will help determine the best treatment. Tests and procedures may include:  Blood tests, stool tests, or breath tests to check for the bacterium H. pylori.  An upper gastrointestinal (GI) series of the esophagus, stomach, and small intestine.  An endoscopy to examine the esophagus, stomach, and small intestine.  A biopsy. TREATMENT  Treatment may include:  Eliminating the cause of the ulcer, such as smoking, NSAIDs, or alcohol.  Medicines to reduce the amount of acid in your digestive  tract.  Antibiotic medicines if the ulcer is caused by the H. pylori bacterium.  An upper endoscopy to treat a bleeding ulcer.  Surgery if the bleeding is severe or if the ulcer created a hole somewhere in the digestive system. HOME CARE INSTRUCTIONS   Avoid tobacco, alcohol, and caffeine. Smoking can increase the acid in the stomach, and continued smoking will impair the healing of ulcers.  Avoid foods and drinks that seem to cause discomfort or aggravate your ulcer.  Only take medicines as directed by your caregiver. Do not substitute over-the-counter medicines for prescription medicines without talking to your caregiver.  Keep any follow-up appointments and tests as directed. SEEK MEDICAL CARE IF:   Your do not improve within 7 days of starting treatment.  You have ongoing indigestion or heartburn. SEEK IMMEDIATE MEDICAL CARE IF:   You have sudden, sharp, or persistent abdominal pain.  You have bloody or dark black, tarry stools.  You vomit blood or vomit that looks like coffee grounds.  You become light-headed, weak, or feel faint.  You become sweaty or clammy. MAKE SURE YOU:   Understand these instructions.  Will watch your condition.  Will get help right away if you are not doing well or get worse. Document Released: 09/10/2000 Document Revised: 01/28/2014 Document Reviewed: 04/12/2012 [facility] Patient Information 2015 [place], [place]. This information is not intended to replace advice given to you by your health care provider. Make sure you discuss any questions you have with your health care provider.

## 2015-05-03 NOTE — ED Provider Notes (Signed)
CSN: [id number]     Arrival date & time 05/03/15  0600 History   First MD Initiated Contact with Patient 05/03/15 [phone]     Chief Complaint  Patient presents with  . Abdominal Pain     (Consider location/radiation/quality/duration/timing/severity/associated sxs/prior Treatment) HPI Sheila Rodriguez is a 39 y.o. female with hx of migraines, GERD, gastric bypass in 2014, presents to ED with complaint of epigastric abdominal pain. Pt states pain started yesterday afternoon. Reports pain is sharp, states "feels like labor." States past lasted several hours yesterday, states went away last night, but returned in the middle of the night and has been constant since. Pt denies nausea or vomiting. Denies diarrhea. Pt denies taking anything for pain prior to coming in. Denies fever or chills. No urinary symptoms. Denies pregnancy. No hx of similar pain in the past.   Past Medical History  Diagnosis Date  . Depression   . Migraines   . Morbid obesity   . Hyperlipidemia     TG - 178 (07/2012) per pt  . GERD (gastroesophageal reflux disease)    Past Surgical History  Procedure Laterality Date  . Laparoscopic cholecystectomy  2000  . Tubal ligation  07/2008  . 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 Upper gi endoscopy  10/16/2012    Procedure: UPPER GI ENDOSCOPY;  Surgeon: Atilano Ina, MD,FACS;  Location: [facility];  Service: General;;   Family History  Problem Relation Age of Onset  . Breast cancer Mother 71  . Cancer Mother     breast  . Colon cancer Paternal Aunt   . Cancer Paternal Aunt     colon  . Bladder Cancer Paternal Aunt   . Cancer Paternal Aunt     colon  . Kidney cancer Maternal Uncle   . Heart disease Father    History  Substance Use Topics  . Smoking status: Former Smoker    Quit date: 12/27/2003  . Smokeless tobacco: Never Used  . Alcohol  Use: Yes     Comment: once a month   OB History    No data available     Review of Systems  Constitutional: Negative for fever and chills.  Respiratory: Negative for cough, chest tightness and shortness of breath.   Cardiovascular: Negative for chest pain, palpitations and leg swelling.  Gastrointestinal: Positive for abdominal pain. Negative for nausea, vomiting and diarrhea.  Genitourinary: Negative for dysuria, flank pain, vaginal bleeding, vaginal discharge, vaginal pain and pelvic pain.  Musculoskeletal: Negative for myalgias, arthralgias, neck pain and neck stiffness.  Skin: Negative for rash.  Neurological: Negative for dizziness, weakness and headaches.  All other systems reviewed and are negative.     Allergies  Review of patient's allergies indicates no known allergies.  Home Medications   Prior to Admission medications   Medication Sig Start Date End Date Taking? Authorizing Provider  acetaminophen (TYLENOL) 500 MG tablet Take 500 mg by mouth every 6 (six) hours as needed.    Historical Provider, MD  buPROPion (WELLBUTRIN) 100 MG tablet TAKE 1 TABLET BY MOUTH 3 TIMES A DAY 04/17/13   Gaynelle Adu, MD  FLUoxetine (PROZAC) 20 MG capsule Take 20 mg by mouth daily.  05/05/12   Historical Provider, MD   BP 164/99 mmHg  Pulse 86  Temp(Src) 98.1 F (36.7 C) (Oral)  Resp 18  SpO2 100%  LMP  04/29/2015 Physical Exam  Constitutional: She is oriented to person, place, and time. She appears well-developed and well-nourished. No distress.  HENT:  Head: Normocephalic.  Eyes: Conjunctivae are normal.  Neck: Neck supple.  Cardiovascular: Normal rate, regular rhythm and normal heart sounds.   Pulmonary/Chest: Effort normal and breath sounds normal. No respiratory distress. She has no wheezes. She has no rales.  Abdominal: Soft. Bowel sounds are normal. She exhibits no distension. There is tenderness. There is no rebound.  Epigastric tenderness  Musculoskeletal: She exhibits no  edema.  Neurological: She is alert and oriented to person, place, and time.  Skin: Skin is warm and dry.  Psychiatric: She has a normal mood and affect. Her behavior is normal.  Nursing note and vitals reviewed.   ED Course  Procedures (including critical care time) Labs Review Labs Reviewed  COMPREHENSIVE METABOLIC PANEL - Abnormal; Notable for the following:    Calcium 8.6 (*)    Total Bilirubin 0.2 (*)    All other components within normal limits  URINALYSIS, ROUTINE W REFLEX MICROSCOPIC (NOT AT [facility]) - Abnormal; Notable for the following:    Hgb urine dipstick TRACE (*)    All other components within normal limits  LIPASE, BLOOD  CBC  URINE MICROSCOPIC-ADD ON  I-STAT BETA HCG BLOOD, ED ([facility], [facility], [facility] ONLY)    Imaging Review Ct Abdomen Pelvis W Contrast  05/03/2015   CLINICAL DATA:  Upper abdominal pain since yesterday, history of gastric bypass and gallbladder removal  EXAM: CT ABDOMEN AND PELVIS WITH CONTRAST  TECHNIQUE: Multidetector CT imaging of the abdomen and pelvis was performed using the standard protocol following bolus administration of intravenous contrast.  CONTRAST:  50mL OMNIPAQUE IOHEXOL 300 MG/ML SOLN, OMNIPAQUE IOHEXOL 300 MG/ML SOLN  COMPARISON:  None.  FINDINGS: Lower chest:  Negative  Hepatobiliary: Status post cholecystectomy.  Otherwise negative.  Pancreas: Negative  Spleen: Negative  Adrenals/Urinary Tract: Adrenal glands are negative. Right kidney is normal. 2 mm stone lower pole left kidney. Prominent left renal pelvis and proximal left ureter. No perinephric inflammation. No stones seen in the ureter or bladder. Bladder appears normal but is distended.  Stomach/Bowel: Status post gastric bypass. No acute abnormalities. Redundant left colon.  Vascular/Lymphatic: No acute findings  Reproductive: 14 x 30 mm subtle hypo attenuating mass posterior left uterus inferiorly. Right adnexa normal. Left adnexa normal. 3mm hyperattenuating focus likely calcification  involving the vaginal labia, nonspecific.  Other: Trace free fluid in the cul-de-sac of the pelvis.  Musculoskeletal: No acute findings  IMPRESSION: Nonspecific physiologic volume of free fluid in the cul-de-sac. 3cm uterine mass, likely a fibroid, could be evaluated with pelvic ultrasound if indicated.  Prominence of the left renal pelvis and proximal left ureter without evidence of perinephric inflammation. Chronicity uncertain. No stones or calculi to suggest distal obstructing lesion.   Electronically Signed   By: Esperanza Heir M.D.   On: 05/03/2015 08:24     EKG Interpretation None      MDM   Final diagnoses:  Epigastric pain   Pt with epigastric abdominal pain, hx of Roux-En-Y 2 years ago. Abdomen soft, no guarding. Pt hypertensive, otherwise normal VS. No fever. Will get labs, pain medications and fluids ordered.   8:52 AM CT back negative, except for 3 cm uterine mass, likely a fibroid. Patient does not have any pelvic pain or lower abdominal tenderness. Her vital signs remained normal. She is afebrile. Blood work is back all unremarkable. Question possible gastritis versus duodenal ulcers. Patient  supposed to be taking Protonix but she states she is not. We'll place back on Protonix, Carafate, Pepcid. Follow-up with her surgeon early next week. If pain continues will likely need endoscopy.  9:26 AM Pt feels better after carafate. Stable for dc at this time  Filed Vitals:   05/03/15 0607 05/03/15 0827 05/03/15 0908 05/03/15 0938  BP: 164/99 138/84 112/85   Pulse: 86 88 72   Temp: 98.1 F (36.7 C)   98.2 F (36.8 C)  TempSrc: Oral   Oral  Resp: SpO2: 100% 98% 98%      Jaynie Crumble, PA-C 05/03/15 1613  Lyndal Pulley, MD 05/03/15 [phone]

## 2015-05-03 NOTE — ED Notes (Signed)
Pt with history of gastric bypass and gallbladder removal has had abdominal pain since yesterday that she compares to "labor pains"

## 2015-05-03 NOTE — ED Notes (Signed)
Pt ambulated out with husband. No apparent distress.

## 2015-05-20 ENCOUNTER — Other Ambulatory Visit: Payer: Self-pay | Admitting: General Surgery

## 2015-05-20 DIAGNOSIS — K316 Fistula of stomach and duodenum: Secondary | ICD-10-CM

## 2015-05-23 ENCOUNTER — Ambulatory Visit
Admission: RE | Admit: 2015-05-23 | Discharge: 2015-05-23 | Disposition: A | Discharge status: Home / Self Care | Payer: BLUE CROSS/BLUE SHIELD | Source: Ambulatory Visit | Attending: General Surgery | Admitting: General Surgery

## 2015-06-17 ENCOUNTER — Encounter (HOSPITAL_COMMUNITY)
Admission: RE | Disposition: A | Discharge status: Home / Self Care | Payer: Self-pay | Source: Ambulatory Visit | Attending: General Surgery

## 2015-06-17 ENCOUNTER — Ambulatory Visit (HOSPITAL_COMMUNITY)
Admission: RE | Admit: 2015-06-17 | Discharge: 2015-06-17 | Disposition: A | Discharge status: Home / Self Care | Payer: BLUE CROSS/BLUE SHIELD | Source: Ambulatory Visit | Attending: General Surgery | Admitting: General Surgery

## 2015-06-17 ENCOUNTER — Encounter (HOSPITAL_COMMUNITY): Payer: Self-pay | Admitting: *Deleted

## 2015-06-17 DIAGNOSIS — F329 Major depressive disorder, single episode, unspecified: Secondary | ICD-10-CM | POA: Diagnosis not present

## 2015-06-17 DIAGNOSIS — E785 Hyperlipidemia, unspecified: Secondary | ICD-10-CM | POA: Insufficient documentation

## 2015-06-17 DIAGNOSIS — Z79899 Other long term (current) drug therapy: Secondary | ICD-10-CM | POA: Insufficient documentation

## 2015-06-17 DIAGNOSIS — Z791 Long term (current) use of non-steroidal anti-inflammatories (NSAID): Secondary | ICD-10-CM | POA: Diagnosis not present

## 2015-06-17 DIAGNOSIS — K219 Gastro-esophageal reflux disease without esophagitis: Secondary | ICD-10-CM | POA: Insufficient documentation

## 2015-06-17 DIAGNOSIS — Z79891 Long term (current) use of opiate analgesic: Secondary | ICD-10-CM | POA: Diagnosis not present

## 2015-06-17 DIAGNOSIS — Z9884 Bariatric surgery status: Secondary | ICD-10-CM | POA: Insufficient documentation

## 2015-06-17 DIAGNOSIS — Z87891 Personal history of nicotine dependence: Secondary | ICD-10-CM | POA: Diagnosis not present

## 2015-06-17 DIAGNOSIS — R1013 Epigastric pain: Secondary | ICD-10-CM | POA: Insufficient documentation

## 2015-06-17 HISTORY — PX: ESOPHAGOGASTRODUODENOSCOPY: SHX5428

## 2015-06-17 SURGERY — EGD (ESOPHAGOGASTRODUODENOSCOPY)
Anesthesia: Moderate Sedation

## 2015-06-17 MED ORDER — ONDANSETRON HCL 4 MG/2ML IJ SOLN
INTRAMUSCULAR | Status: AC
  Filled 2015-06-17: qty 2

## 2015-06-17 MED ORDER — ONDANSETRON HCL 4 MG/2ML IJ SOLN
INTRAMUSCULAR | Status: DC | PRN
  Administered 2015-06-17: 4 mg via INTRAVENOUS

## 2015-06-17 MED ORDER — MIDAZOLAM HCL 5 MG/ML IJ SOLN
INTRAMUSCULAR | Status: AC
  Filled 2015-06-17: qty 2

## 2015-06-17 MED ORDER — MIDAZOLAM HCL 10 MG/2ML IJ SOLN
INTRAMUSCULAR | Status: DC | PRN
  Administered 2015-06-17: 2 mg via INTRAVENOUS
  Administered 2015-06-17: 1 mg via INTRAVENOUS
  Administered 2015-06-17 (×3): 2 mg via INTRAVENOUS

## 2015-06-17 MED ORDER — FENTANYL CITRATE (PF) 100 MCG/2ML IJ SOLN
INTRAMUSCULAR | Status: AC
  Filled 2015-06-17: qty 4

## 2015-06-17 MED ORDER — FENTANYL CITRATE (PF) 100 MCG/2ML IJ SOLN
INTRAMUSCULAR | Status: DC | PRN
  Administered 2015-06-17 (×8): 25 ug via INTRAVENOUS

## 2015-06-17 MED ORDER — DIPHENHYDRAMINE HCL 50 MG/ML IJ SOLN
INTRAMUSCULAR | Status: AC
  Filled 2015-06-17: qty 1

## 2015-06-17 MED ORDER — BUTAMBEN-TETRACAINE-BENZOCAINE 2-2-14 % EX AERO
INHALATION_SPRAY | CUTANEOUS | Status: DC | PRN
  Administered 2015-06-17: 2 via TOPICAL

## 2015-06-17 MED ORDER — SODIUM CHLORIDE 0.9 % IV SOLN
INTRAVENOUS | Status: DC
  Administered 2015-06-17: 500 mL via INTRAVENOUS

## 2015-06-17 NOTE — Discharge Instructions (Signed)
Esophagogastroduodenoscopy °Care After °Refer to this sheet in the next few weeks. These instructions provide you with information on caring for yourself after your procedure. Your caregiver may also give you more specific instructions. Your treatment has been planned according to current medical practices, but problems sometimes occur. Call your caregiver if you have any problems or questions after your procedure.  °HOME CARE INSTRUCTIONS °· Do not eat or drink anything until the numbing medicine (local anesthetic) has worn off and your gag reflex has returned. You will know that the local anesthetic has worn off when you can swallow comfortably. °· Do not drive for 12 hours after the procedure or as directed by your caregiver. °· Only take medicines as directed by your caregiver. °SEEK MEDICAL CARE IF:  °· You cannot stop coughing. °· You are not urinating at all or less than usual. °SEEK IMMEDIATE MEDICAL CARE IF: °· You have difficulty swallowing. °· You cannot eat or drink. °· You have worsening throat or chest pain. °· You have dizziness, lightheadedness, or you faint. °· You have nausea or vomiting. °· You have chills. °· You have a fever. °· You have severe abdominal pain. °· You have black, tarry, or bloody stools. °Document Released: 08/30/2012 Document Reviewed: 08/30/2012 °[employer]® Patient Information ©2015 [employer]. This information is not intended to replace advice given to you by your health care provider. Make sure you discuss any questions you have with your health care provider. ° °

## 2015-06-17 NOTE — Op Note (Signed)
[facility] [address] [place], [address]   ENDOSCOPY PROCEDURE REPORT  PATIENT: Sheila Rodriguez, Sheila Rodriguez  MR#: [id number] BIRTHDATE: Feb 02, 1976 , 39  yrs. 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 MEDICATIONS: Fentanyl 200 mcg IV, Versed 9 mg IV, and Zofran  IV  TOPICAL ANESTHETIC: Cetacaine Spray  DESCRIPTION OF PROCEDURE: After the risks benefits and alternatives of the procedure were thoroughly explained, informed consent was obtained.  The PENTAX GASTOROSCOPE [id number] endoscope was introduced through the mouth and advanced to the anastomosis , Without limitations.  The instrument was slowly withdrawn as the mucosa was fully examined.    GE junction at 43 cm.  pouch normal size about 4-5 cm, round. mucosa intact.  no visible gastritis in pouch.  no ulcers in pouch. a few residual staples seen at anatomosis.  anastomosis widely patent.  no evidence of stricture.  roux limb appeared normal. blind limb (candy cane limb) also normal.  no evidence of ulcer on intestinal side of anastomosis.  Retroflexion was not performed. The scope was then withdrawn from the patient and the procedure completed.  COMPLICATIONS: There were no immediate complications.  ENDOSCOPIC IMPRESSION: normal post roux en y gastric bypass appearance  RECOMMENDATIONS: 1.  Call for office visit to be seen in weeks 2.  Continue PPI  REPEAT EXAM: for recall: no repeat exam necessary.  eSigned:  Gaynelle Adu, MD 06/17/2015 8:31 AM    CC:

## 2015-06-17 NOTE — H&P (Signed)
Sheila Rodriguez is an 39 y.o. female.   Chief Complaint: epigastric pain HPI: 39 yo female s/p roux en y gastric bypass 2014 who developed epigastric pain and was empirically treated for marginal ulcer with protonix, carafate. Her symptoms improved however on her CT in the ED there appeared to be contrast in her excluded stomach. There was no evidence of internal hernia. She had also had several distinct episodes of dry coughing at night so much that she had spit up blood on 3 remote prior occasions. She is asymptomatic while taking protonix.   Past Medical History  Diagnosis Date  . Depression   . Migraines   . Morbid obesity   . Hyperlipidemia     TG - 178 (07/2012) per pt  . GERD (gastroesophageal reflux disease)     Past Surgical History  Procedure Laterality Date  . Laparoscopic cholecystectomy  2000  . Tubal ligation  07/2008  . 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 Upper gi endoscopy  10/16/2012    Procedure: UPPER GI ENDOSCOPY;  Surgeon: Atilano Ina, MD,FACS;  Location: [facility];  Service: General;;    Family History  Problem Relation Age of Onset  . Breast cancer Mother 17  . Cancer Mother     breast  . Colon cancer Paternal Aunt   . Cancer Paternal Aunt     colon  . Bladder Cancer Paternal Aunt   . Cancer Paternal Aunt     colon  . Kidney cancer Maternal Uncle   . Heart disease Father    Social History:  reports that she quit smoking about 11 years ago. She has never used smokeless tobacco. She reports that she drinks alcohol. She reports that she does not use illicit drugs.  Allergies: No Known Allergies  Medications Prior to Admission  Medication Sig Dispense Refill  . buPROPion (WELLBUTRIN XL) 150 MG 24 hr tablet Take 150-300 mg by mouth 2 (two) times daily. 300 mg in the am and 150 mg in the evening    . famotidine (PEPCID) 20  MG tablet Take 1 tablet (20 mg total) by mouth 2 (two) times daily. 30 tablet 0  . FLUoxetine (PROZAC) 20 MG capsule Take 40 mg by mouth daily.     Marland Kitchen ibuprofen (ADVIL,MOTRIN) 200 MG tablet Take 200 mg by mouth every 6 (six) hours as needed for moderate pain.    . Multiple Vitamin (MULTIVITAMIN WITH MINERALS) TABS tablet Take 1 tablet by mouth daily.    . pantoprazole (PROTONIX) 20 MG tablet Take 1 tablet (20 mg total) by mouth daily. 30 tablet 0  . sucralfate (CARAFATE) 1 G tablet Take 1 tablet (1 g total) by mouth 4 (four) times daily -  with meals and at bedtime. 60 tablet 0  . buPROPion (WELLBUTRIN) 100 MG tablet TAKE 1 TABLET BY MOUTH 3 TIMES A DAY (Patient not taking: Reported on 05/03/2015) 90 tablet 3  . HYDROcodone-acetaminophen (NORCO/VICODIN) 5-325 MG per tablet Take 1-2 tablets by mouth every 4 (four) hours as needed. 20 tablet 0    No results found for this or any previous visit (from the past 48 hour(s)). No results found.  Review of Systems  Constitutional: Negative for weight loss.  HENT: Negative for nosebleeds.   Eyes: Negative for blurred vision.  Respiratory: Negative for shortness of breath.   Cardiovascular: Negative for chest pain,  palpitations, orthopnea and PND.       Denies DOE  Gastrointestinal: Negative for nausea, vomiting and abdominal pain.  Genitourinary: Negative for dysuria and hematuria.  Musculoskeletal: Negative.   Skin: Negative for itching and rash.  Neurological: Negative for dizziness, focal weakness, seizures, loss of consciousness and headaches.       Denies TIAs, amaurosis fugax  Endo/Heme/Allergies: Does not bruise/bleed easily.  Psychiatric/Behavioral: The patient is not nervous/anxious.     Blood pressure 153/93, pulse 84, temperature 98.6 F (37 C), temperature source Oral, resp. rate 17, height  (1.676 m), weight 74.844 kg (165 lb), last menstrual period 06/17/2015, SpO2 98 %. Physical Exam  Vitals reviewed. Constitutional: She is  oriented to person, place, and time. She appears well-developed and well-nourished. No distress.  HENT:  Head: Normocephalic and atraumatic.  Right Ear: External ear normal.  Left Ear: External ear normal.  Eyes: Conjunctivae are normal. No scleral icterus.  Neck: Normal range of motion. Neck supple. No tracheal deviation present. No thyromegaly present.  Cardiovascular: Normal rate and normal heart sounds.   Respiratory: Effort normal and breath sounds normal. No stridor. No respiratory distress. She has no wheezes.  Class 2 airway  GI: Soft. She exhibits no distension. There is no tenderness. There is no rebound.  Well healed incisions  Musculoskeletal: She exhibits no edema or tenderness.  Lymphadenopathy:    She has no cervical adenopathy.  Neurological: She is alert and oriented to person, place, and time. She exhibits normal muscle tone.  Skin: Skin is warm and dry. No rash noted. She is not diaphoretic. No erythema. No pallor.  Psychiatric: She has a normal mood and affect. Her behavior is normal. Judgment and thought content normal.     Assessment/Plan H/o roux en y gastric bypass Epigastric pain  Concern for marginal ulcer &/or gastro-gastro fistula given contrast seen in excluded stomach Discussed pros/cons of upper endo with pt in the office Discussed risk such as bleeding, injury to surrounding structure, perforation, non-diagnostic, airway issues, over sedation.   Pt elects to proceed  Mary Sella. Andrey Campanile, MD, FACS General, Bariatric, & Minimally Invasive Surgery [facility], [place]   [facility] M 06/17/2015, 7:15 AM

## 2017-03-03 IMAGING — CR DG CHEST 2V
2 series · 2 of 2 positions shown · non-contrast
Comparison: 07/26/2012.

CLINICAL DATA: 39-year-old female with hemoptysis 3 days ago.
Former smoker, quit in [id number]. Initial encounter.

EXAM:
CHEST  2 VIEW

[view not recorded (1 of 2)]
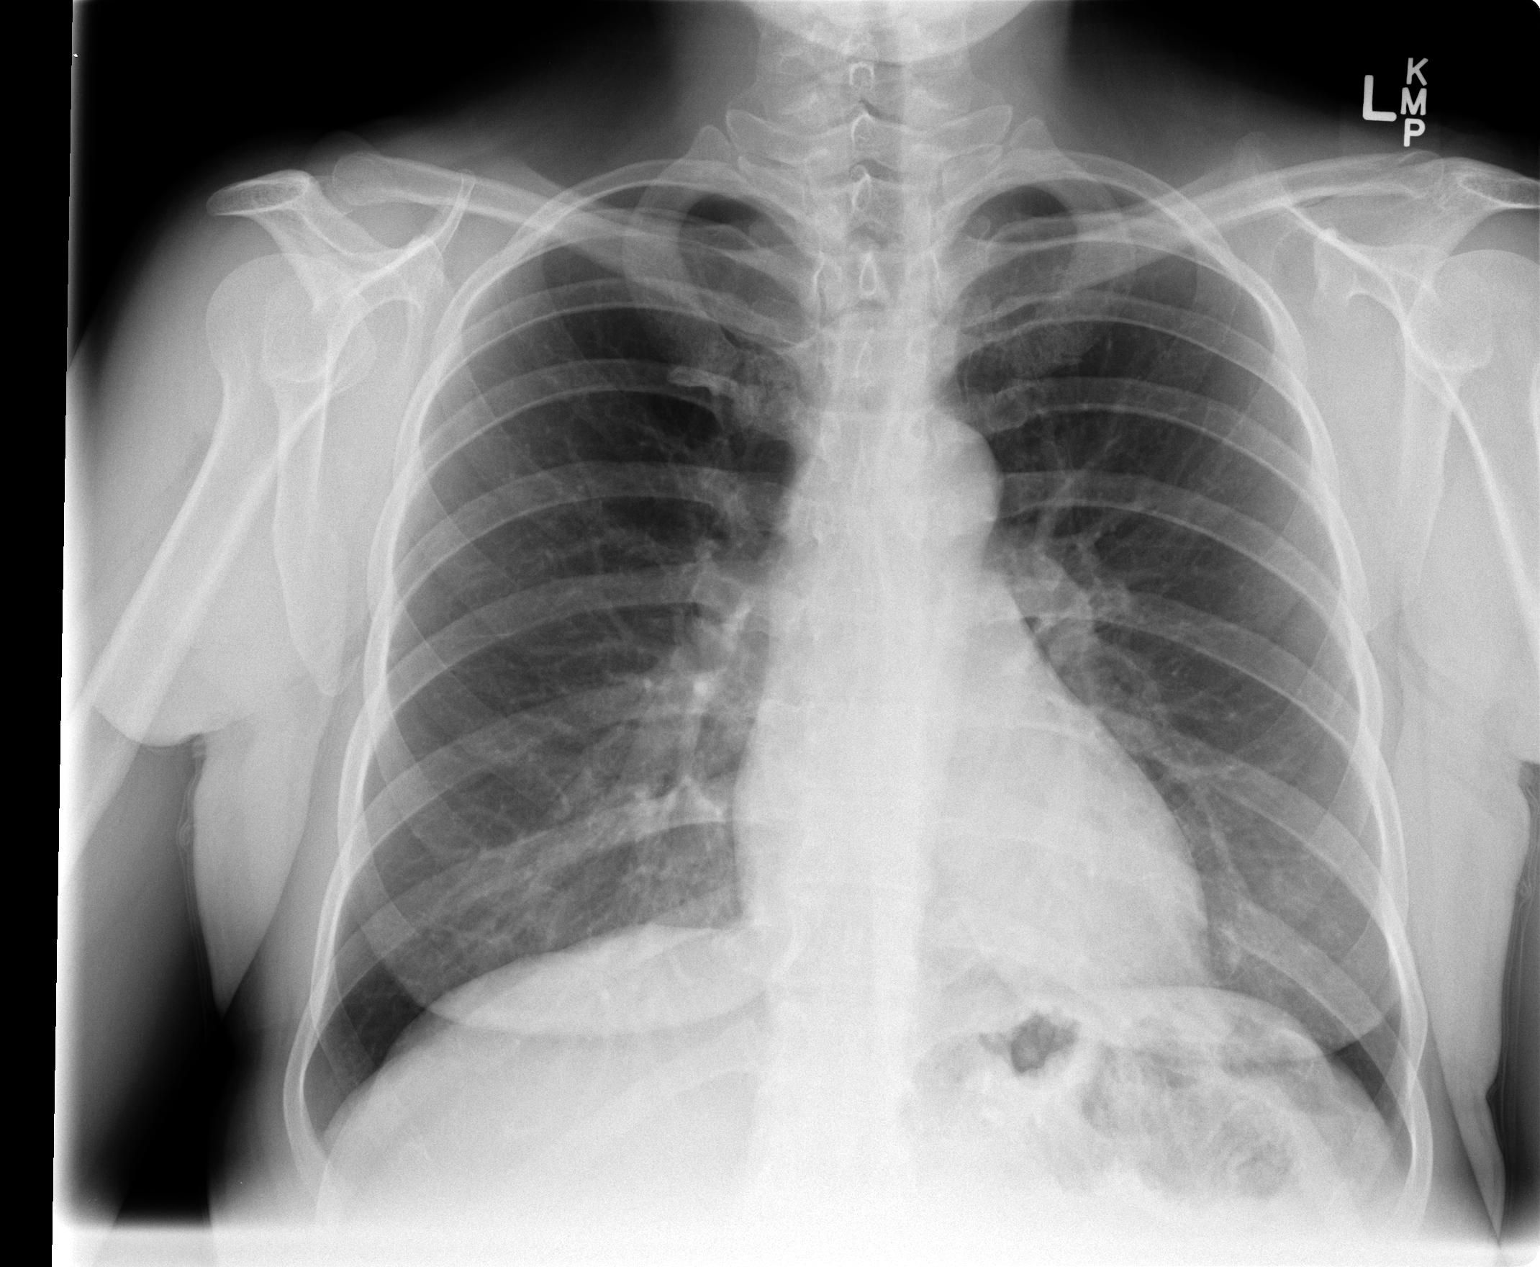

[view not recorded (2 of 2)]
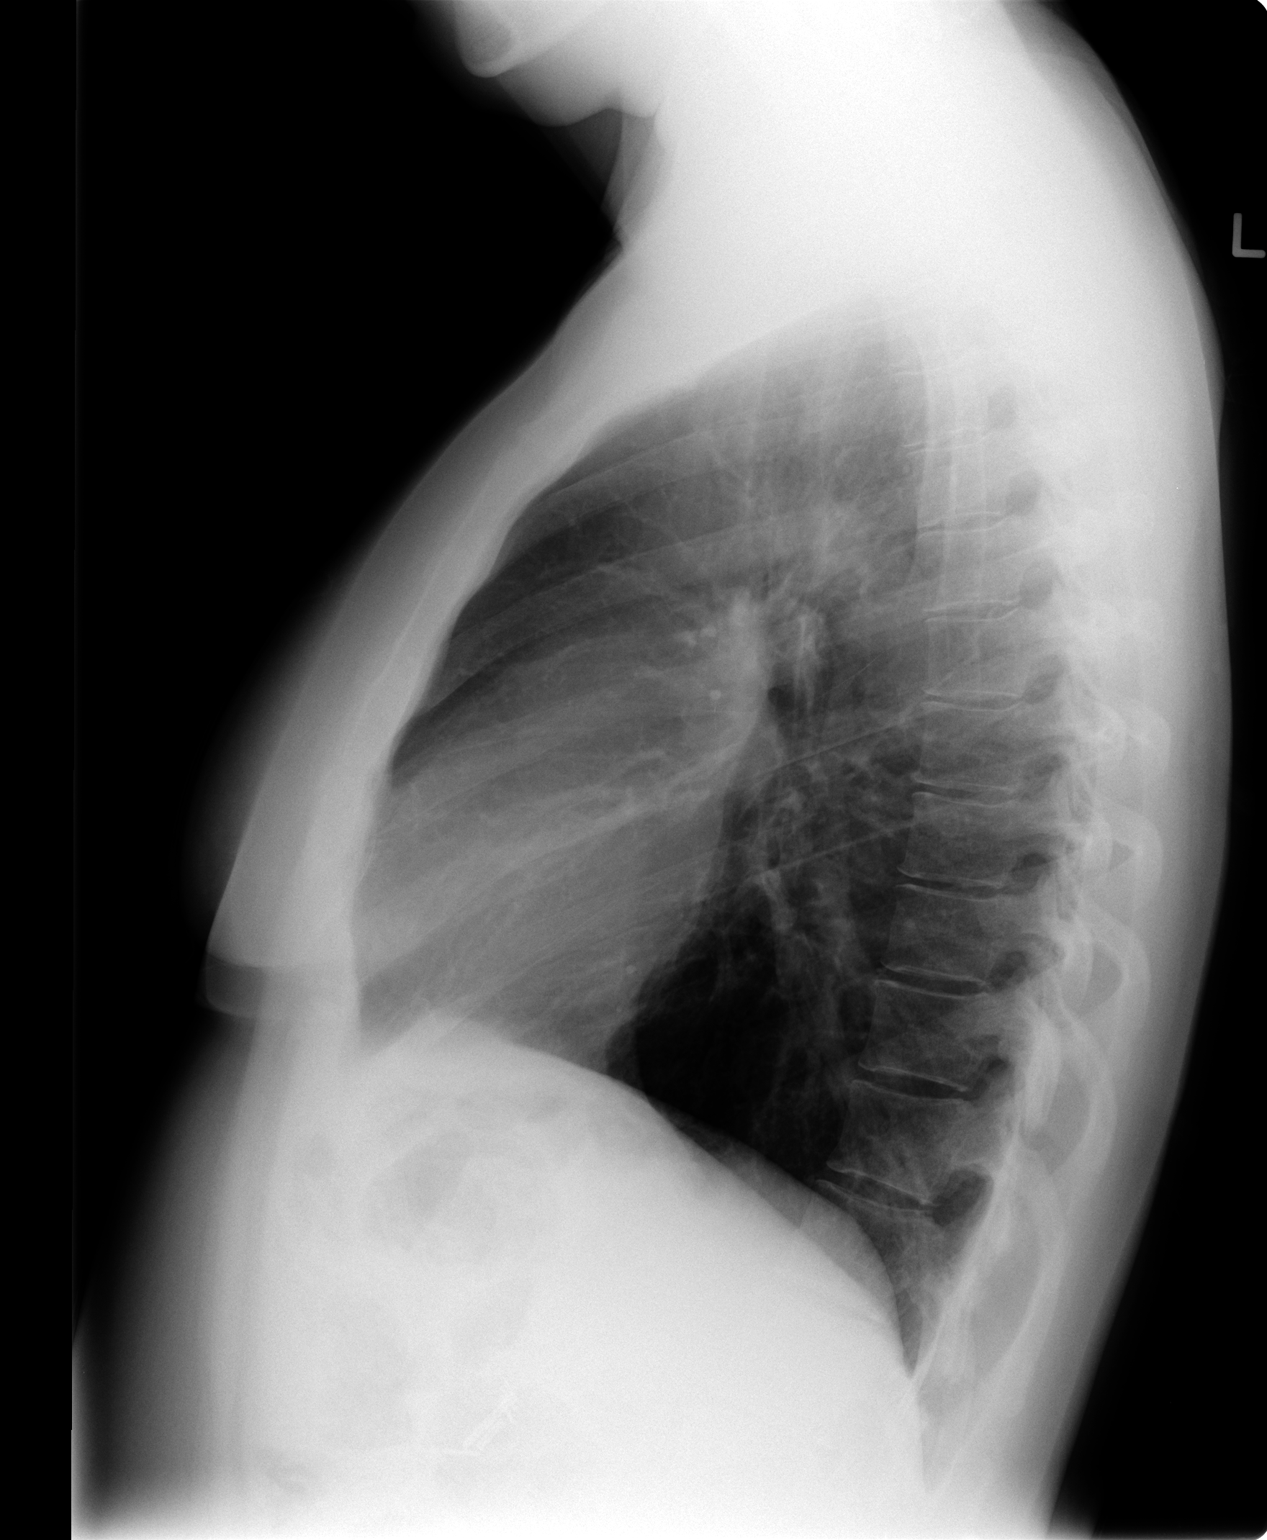

[2 of 2 positions shown; findings below may reference images not displayed]

FINDINGS: Upper limits of normal lung volumes. Normal cardiac size and
mediastinal contours. Visualized tracheal air column is within
normal limits. No pneumothorax or pulmonary edema. No pleural
effusion or consolidation. There is patchy increased medial right
lung base opacity seen only on the frontal view. No other confluent
pulmonary opacity. Stable cholecystectomy clips. No acute osseous
abnormality identified.
IMPRESSION: Patchy increased opacity at the medial right lung base seen only on
the frontal view is suggestive of bronchopneumonia in this setting.
No pleural effusion.

Followup PA and lateral chest X-ray is recommended in 3-4 weeks
following trial of antibiotic therapy to ensure resolution and
exclude underlying malignancy.

These results will be called to the ordering clinician or
representative by the Radiologist Assistant, and communication
documented in the PACS or zVision Dashboard.

## 2017-04-05 IMAGING — CR DG CHEST 2V
2 series · 2 of 2 positions shown · non-contrast
Comparison: February 27, 2015.

CLINICAL DATA: Hemoptysis.

EXAM:
CHEST  2 VIEW

[w chest pa]
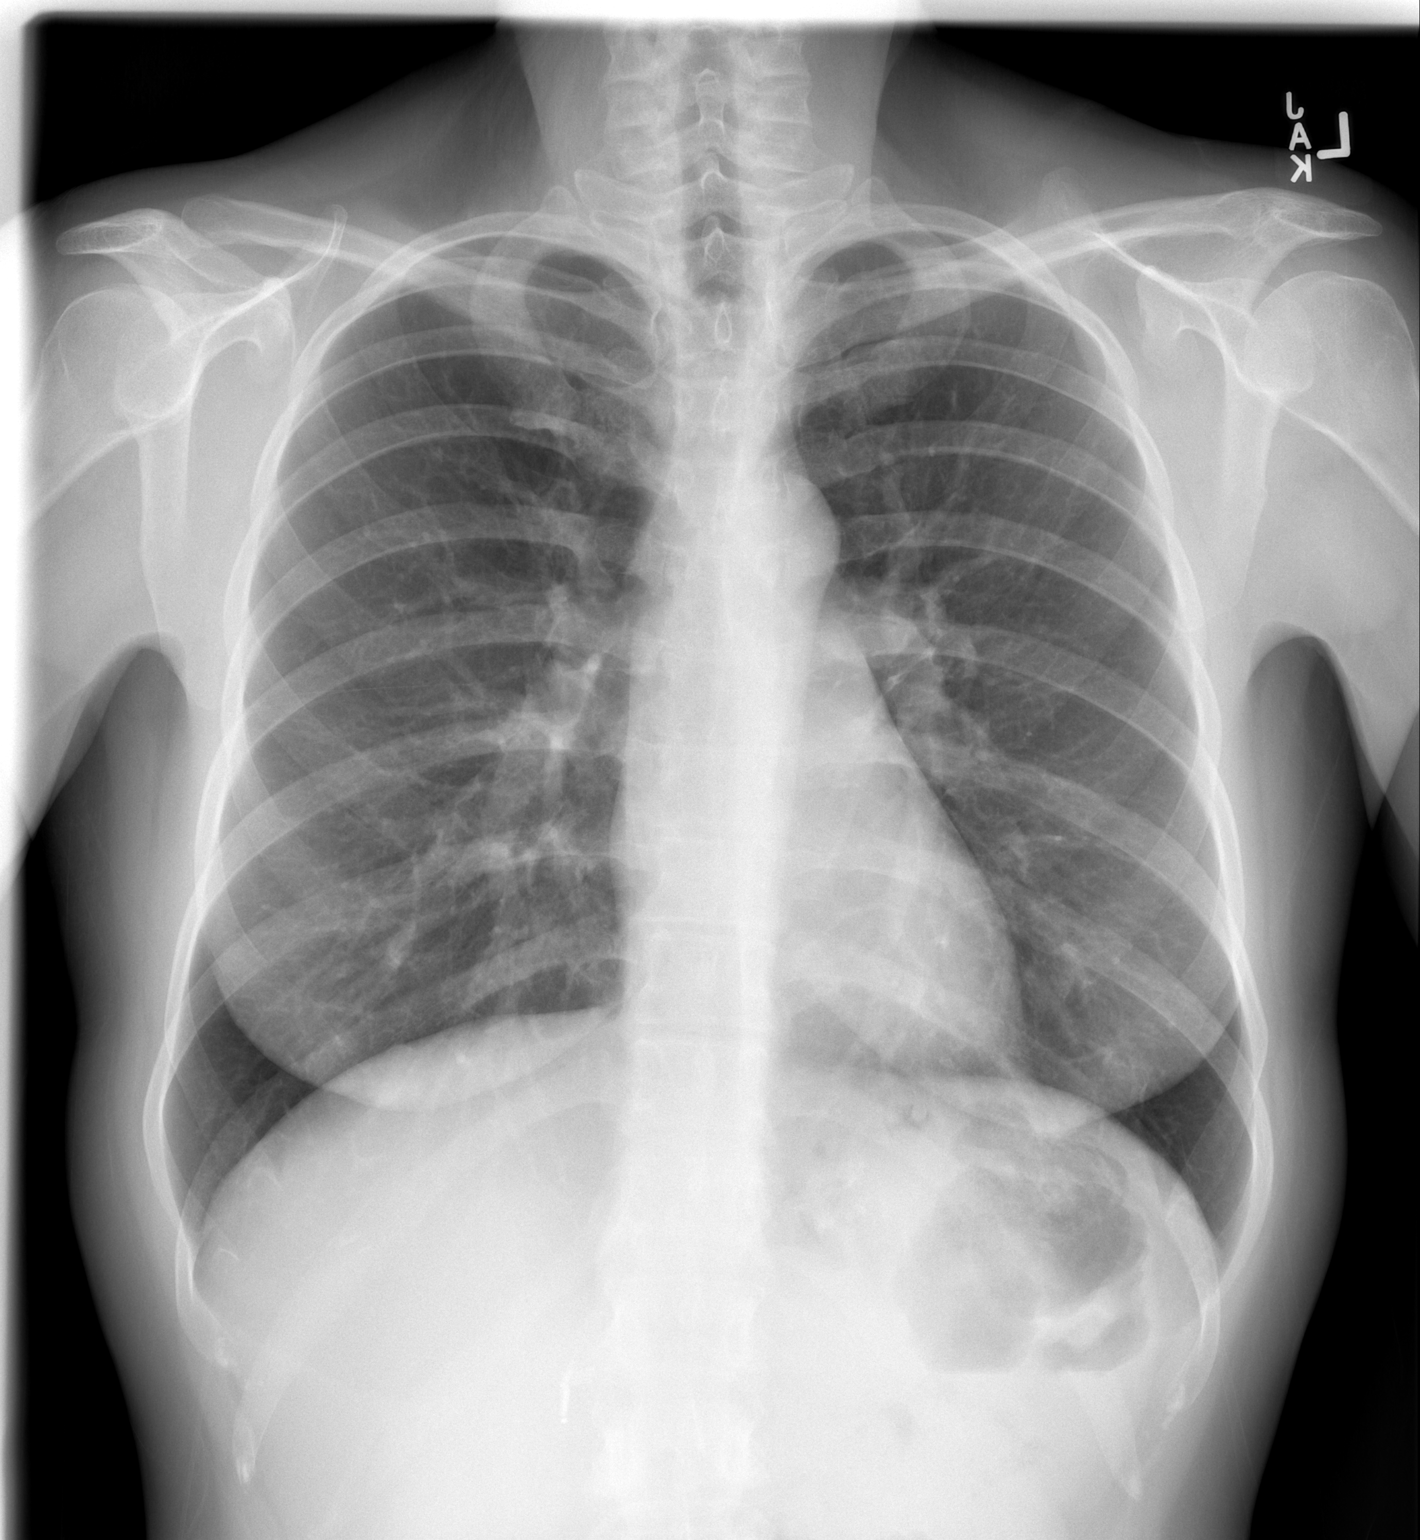

[w chest lat]
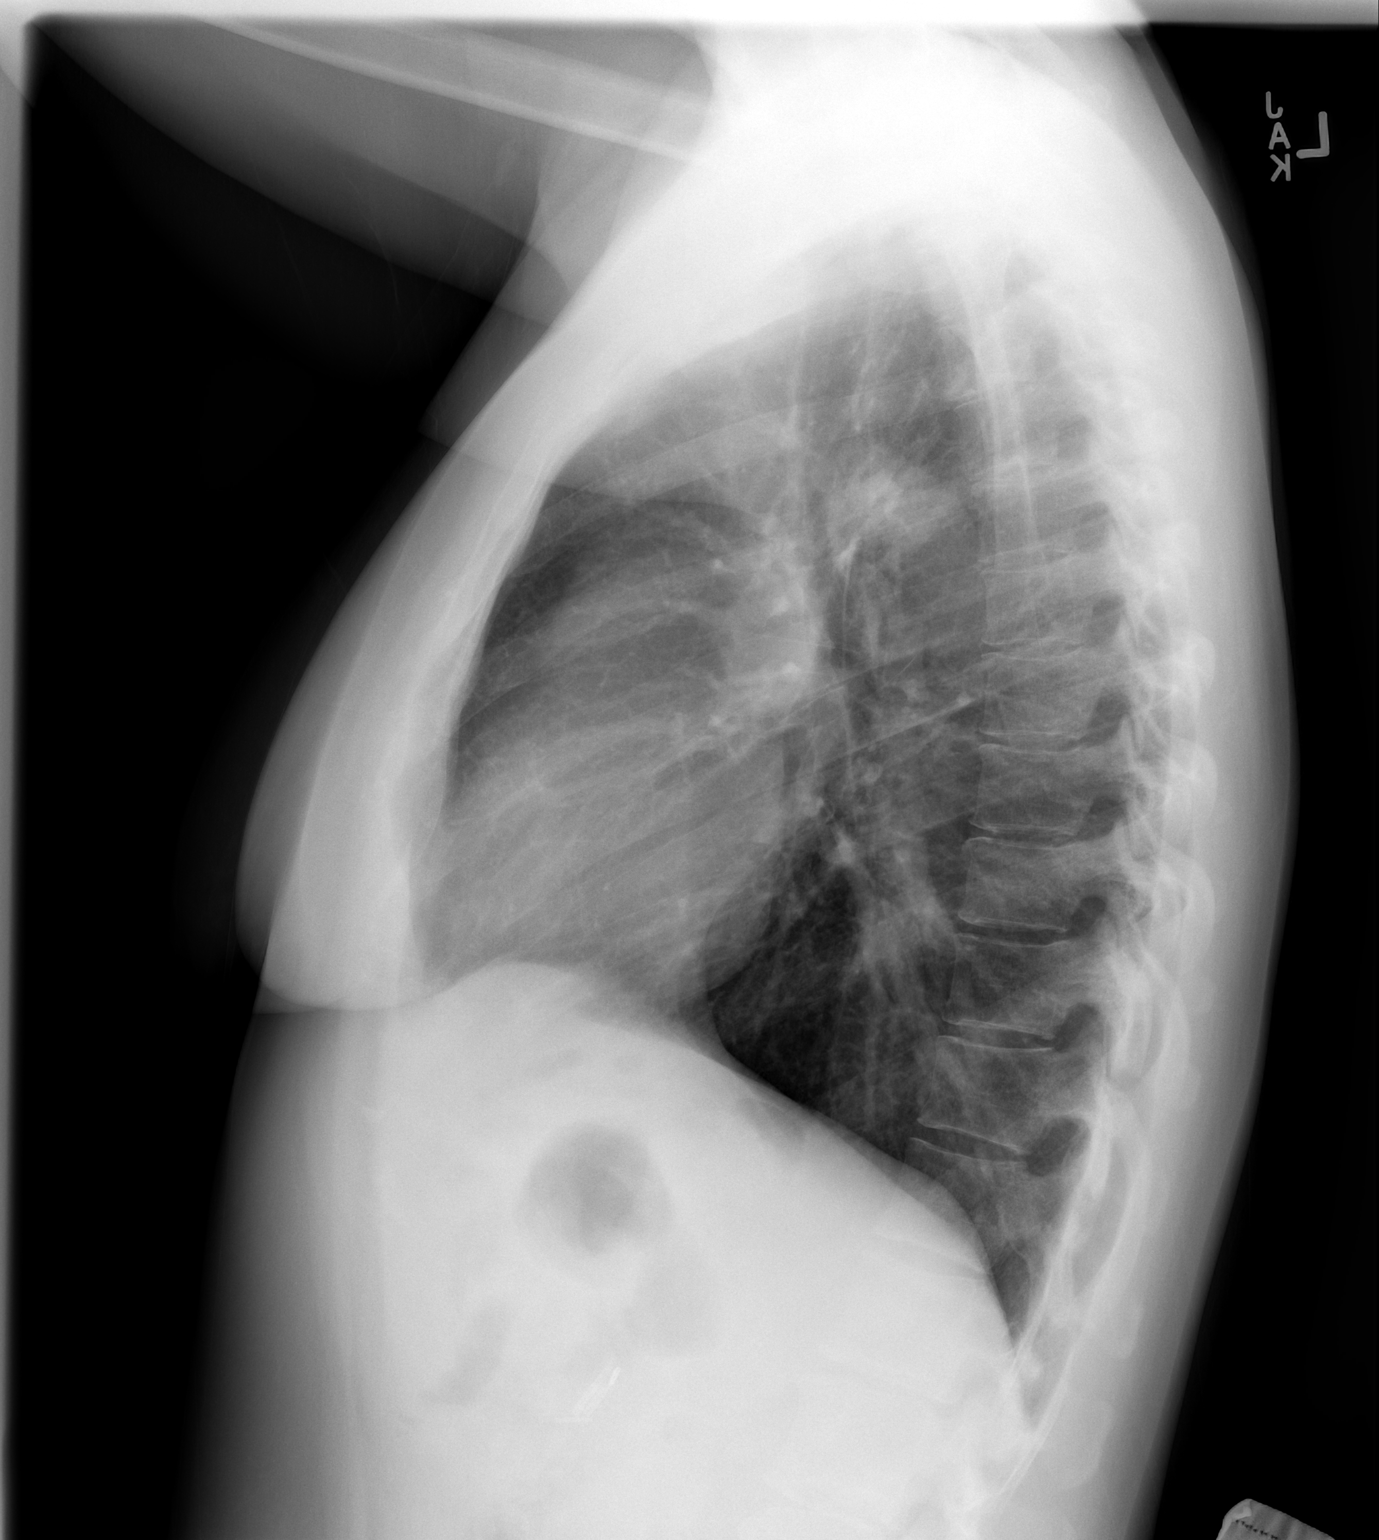

[2 of 2 positions shown; findings below may reference images not displayed]

FINDINGS: The heart size and mediastinal contours are within normal limits.
Both lungs are clear. No pneumothorax or pleural effusion is noted.
The visualized skeletal structures are unremarkable.
IMPRESSION: No active cardiopulmonary disease.

## 2017-05-16 ENCOUNTER — Encounter (HOSPITAL_COMMUNITY): Payer: Self-pay

## 2019-09-06 ENCOUNTER — Other Ambulatory Visit: Payer: Self-pay

## 2019-09-06 DIAGNOSIS — Z20822 Contact with and (suspected) exposure to covid-19: Secondary | ICD-10-CM

## 2019-09-08 LAB — NOVEL CORONAVIRUS, NAA: SARS-CoV-2, NAA: DETECTED — AB

## 2022-07-21 ENCOUNTER — Other Ambulatory Visit: Payer: Self-pay | Admitting: Chiropractor

## 2022-07-21 DIAGNOSIS — M5382 Other specified dorsopathies, cervical region: Secondary | ICD-10-CM

## 2022-07-21 DIAGNOSIS — M9901 Segmental and somatic dysfunction of cervical region: Secondary | ICD-10-CM

## 2022-07-21 DIAGNOSIS — M50122 Cervical disc disorder at C5-C6 level with radiculopathy: Secondary | ICD-10-CM

## 2022-08-05 ENCOUNTER — Ambulatory Visit
Admission: RE | Admit: 2022-08-05 | Discharge: 2022-08-05 | Disposition: A | Discharge status: Home / Self Care | Payer: No Typology Code available for payment source | Source: Ambulatory Visit | Attending: Chiropractor | Admitting: Chiropractor

## 2022-08-05 DIAGNOSIS — M5382 Other specified dorsopathies, cervical region: Secondary | ICD-10-CM

## 2022-08-05 DIAGNOSIS — M9901 Segmental and somatic dysfunction of cervical region: Secondary | ICD-10-CM

## 2022-08-05 DIAGNOSIS — M50122 Cervical disc disorder at C5-C6 level with radiculopathy: Secondary | ICD-10-CM

## 2022-09-01 LAB — COLOGUARD: COLOGUARD: NEGATIVE

## 2022-09-07 ENCOUNTER — Ambulatory Visit: Payer: BLUE CROSS/BLUE SHIELD | Admitting: Family

## 2022-11-23 ENCOUNTER — Ambulatory Visit: Payer: BLUE CROSS/BLUE SHIELD | Admitting: Family

## 2023-01-28 ENCOUNTER — Ambulatory Visit: Payer: No Typology Code available for payment source | Admitting: Family

## 2023-05-19 ENCOUNTER — Encounter: Payer: Self-pay | Admitting: Nurse Practitioner

## 2023-05-19 ENCOUNTER — Ambulatory Visit: Payer: No Typology Code available for payment source | Admitting: Nurse Practitioner

## 2023-05-19 VITALS — BP 120/78 | HR 85 | Temp 98.3°F | Ht 66.05 in | Wt 210.0 lb

## 2023-05-19 DIAGNOSIS — Z820 Family history of epilepsy and other diseases of the nervous system: Secondary | ICD-10-CM | POA: Diagnosis not present

## 2023-05-19 DIAGNOSIS — Z87891 Personal history of nicotine dependence: Secondary | ICD-10-CM

## 2023-05-19 DIAGNOSIS — Z0000 Encounter for general adult medical examination without abnormal findings: Secondary | ICD-10-CM

## 2023-05-19 DIAGNOSIS — Z13220 Encounter for screening for lipoid disorders: Secondary | ICD-10-CM

## 2023-05-19 DIAGNOSIS — Z23 Encounter for immunization: Secondary | ICD-10-CM

## 2023-05-19 DIAGNOSIS — R202 Paresthesia of skin: Secondary | ICD-10-CM

## 2023-05-19 DIAGNOSIS — E669 Obesity, unspecified: Secondary | ICD-10-CM | POA: Diagnosis not present

## 2023-05-19 DIAGNOSIS — Z9884 Bariatric surgery status: Secondary | ICD-10-CM

## 2023-05-19 NOTE — Assessment & Plan Note (Signed)
Paresthesias and tremor if all lab work is negative referred to neurology for further workup.

## 2023-05-19 NOTE — Assessment & Plan Note (Signed)
Urine microscopy for ruling out microscopic hematuria

## 2023-05-19 NOTE — Progress Notes (Signed)
New Patient Office Visit  Subjective    Patient ID: Sheila Rodriguez, female    DOB: March 01, 1976  Age: 47 y.o. MRN: [id number]  CC:  Chief Complaint  Patient presents with   Establish Care    Pt wants Tdap. Sees GYN for PAP.   Numbness    Pt complains of tingling and numbness in both hands. Pt states symptoms are ongoing for a long time.     HPI Sheila Rodriguez presents to establish care   Anxiety/depression: Sheila Rodriguez [facility]  psychiatric once a year.  Patient currently managed on fluoxetine and bupropion.  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  Recently did call but they are now affiliated with a different hospital system require referral patient will defer at this current juncture  Numbness: bilateral hands that is all the time. It will get worse at time. States that she has had it for years. States that over the past few years she had upper arm pain. States xray and MRI of the neck that was normal after chiropractor. States that she was seen at ortho and given gabapentin and steroids and was referred to pain management. No EMG testing. States that her brother noticed that she has a tremor. States that tremor is more with activity. States that it is present with rest.   for complete physical and follow up of chronic conditions.  Immunizations: -Tetanus:  update today -Influenza: normally takes it through employer -Shingles: Too young -Pneumonia: Too young -COVID: Original 2  Diet: Fair diet. States that she will graze. Eating a little through out the day.  Exercise: No regular exercise.  Eye exam: Completes annually. Has glasses  Dental exam: Completes semi-annually    Colonoscopy: Completed in December 2023 Cologuard that was negative.  Does not have first-degree relative with colorectal  cancer Lung Cancer Screening: Does not qualify  Pap smear: GYN.no abnormal pap. Last November. Sheila Rodriguez    Mammogram: march 2024 normal.  Mother had breast cancer early age.  Patient has been genetically tested and does not carry the BRCA gene  Sleep: states she goes to 1030-630. Feels rested and does not snore. But moves and talks in her sleep     Outpatient Encounter Medications as of 05/19/2023  Medication Sig   buPROPion (WELLBUTRIN XL) 150 MG 24 hr tablet Take 150-300 mg by mouth 2 (two) times daily. 300 mg in the am and 150 mg in the evening   FLUoxetine (PROZAC) 20 MG capsule Take 40 mg by mouth daily.    naltrexone (DEPADE) 50 MG tablet Take 50 mg by mouth daily.   [DISCONTINUED] famotidine (PEPCID) 20 MG tablet Take 1 tablet (20 mg total) by mouth 2 (two) times daily.   [DISCONTINUED] HYDROcodone-acetaminophen (NORCO/VICODIN) 5-325 MG per tablet Take 1-2 tablets by mouth every 4 (four) hours as needed.   [DISCONTINUED] Multiple Vitamin (MULTIVITAMIN WITH MINERALS) TABS tablet Take 1 tablet by mouth daily.   [DISCONTINUED] pantoprazole (PROTONIX) 20 MG tablet Take 1 tablet (20 mg total) by mouth daily.   [DISCONTINUED] sucralfate (CARAFATE) 1 G tablet Take 1 tablet (1 g total) by mouth 4 (four) times daily -  with meals and at bedtime.   No facility-administered encounter medications on file as of 05/19/2023.    Past Medical History:  Diagnosis Date   Depression    GERD (gastroesophageal reflux disease)    Hyperlipidemia    TG - 178 (07/2012) per pt   Migraines    Morbid obesity [facility])     Past Surgical History:  Procedure Laterality Date   ESOPHAGOGASTRODUODENOSCOPY N/A 06/17/2015   Procedure: ESOPHAGOGASTRODUODENOSCOPY (EGD);  Surgeon: Gaynelle Adu, MD;  Location: [facility] ENDOSCOPY;  Service: General;  Laterality: N/A;   GASTRIC ROUX-EN-Y  10/16/2012   Procedure: LAPAROSCOPIC ROUX-EN-Y GASTRIC;  Surgeon: Atilano Ina, MD,FACS;  Location: [facility] ORS;  Service: General;   Laterality: N/A;   gastric ulcer     repair   LAPAROSCOPIC CHOLECYSTECTOMY  09/27/1998   TUBAL LIGATION  07/28/2008   UPPER GI ENDOSCOPY  10/16/2012   Procedure: UPPER GI ENDOSCOPY;  Surgeon: Atilano Ina, MD,FACS;  Location: [facility] ORS;  Service: General;;    Family History  Problem Relation Age of Onset   Breast cancer Mother 70   Cancer Mother        breast   Colon cancer Paternal Aunt    Cancer Paternal Aunt        colon   Bladder Cancer Paternal Aunt    Cancer Paternal Aunt        colon   Kidney cancer Maternal Uncle    Heart disease Father     Social History   Socioeconomic History   Marital status: Married    Spouse name: Sheila Rodriguez   Number of children: 3   Years of education: Not on file   Highest education level: Not on file  Occupational History   Not on file  Tobacco Use   Smoking status: Former    Current packs/day: 0.00    Types: Cigarettes    Quit date: 12/27/2003    Years since quitting: 19.4   Smokeless tobacco: Never  Vaping Use   Vaping status: Never Used  Substance and Sexual Activity   Alcohol use: Not Currently    Comment: once a month   Drug use: No   Sexual activity: Not on file  Other Topics Concern   Not on file  Social History Narrative   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  Respiratory:  Negative for shortness of breath.   Cardiovascular:  Negative for chest pain and leg swelling.  Gastrointestinal:  Negative for abdominal pain, blood in stool, constipation, diarrhea, nausea and vomiting.       BM every 2-3 days   Genitourinary:  Negative for dysuria and  hematuria.       Stress incontinence.   Neurological:  Positive for tingling. Negative for headaches.  Psychiatric/Behavioral:  Negative for hallucinations and suicidal ideas.         Objective    BP 120/78   Pulse 85   Temp 98.3 F (36.8 C) (Temporal)   Ht 5' 6.05" (1.678 m)   Wt 210 lb (95.3 kg)   LMP 04/27/2022   SpO2 97%   BMI 33.84 kg/m   Physical Exam Vitals and nursing note reviewed.  Constitutional:      Appearance: Normal appearance.  HENT:     Right Ear: Tympanic membrane, ear canal and external ear normal.     Left Ear: Tympanic membrane, ear canal and external ear normal.     Mouth/Throat:     Mouth: Mucous membranes are moist.     Pharynx: Oropharynx is clear.  Eyes:     Extraocular Movements: Extraocular movements intact.     Pupils: Pupils are equal, round, and reactive to light.  Cardiovascular:     Rate and Rhythm: Normal rate and regular rhythm.     Pulses: Normal pulses.     Heart sounds: Normal heart sounds.  Pulmonary:     Effort: Pulmonary effort is normal.     Breath sounds: Normal breath sounds.  Abdominal:     General: Bowel sounds are normal. There is no distension.     Palpations: There is no mass.     Tenderness: There is no abdominal tenderness.     Hernia: No hernia is present.  Musculoskeletal:     Right lower leg: No edema.     Left lower leg: No edema.  Lymphadenopathy:     Cervical: No cervical adenopathy.  Skin:    General: Skin is warm.  Neurological:     General: No focal deficit present.     Mental Status: She is alert.     Deep Tendon Reflexes:     Reflex Scores:      Bicep reflexes are 2+ on the right side and 2+ on the left side.      Patellar reflexes are 2+ on the right side and 2+ on the left side.    Comments: Bilateral upper and lower extremity strength 5/5  Psychiatric:        Mood and Affect: Mood normal.        Behavior: Behavior normal.        Thought Content: Thought content normal.        Judgment:  Judgment normal.         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  Check vitamin B-12 and vitamin D      Relevant Orders   VITAMIN D 25 Hydroxy (Vit-D Deficiency, Fractures)   Vitamin B12   Preventative health care - Primary    Discussed age-appropriate immunizations and screening exams.  Did review patient's personal, surgical, social, family histories.  Update tetanus vaccine today.  Patient up-to-date on all other age-appropriate vaccinations.  Patient is up-to-date on CRC screening, breast cancer screening, cervical cancer screening.  Patient was given information at discharge about preventative healthcare maintenance with anticipatory guidance.      Relevant Orders   CBC   Comprehensive metabolic panel   TSH   Paresthesia    Bilateral upper extremities that is encouraging more more of the arm proximally.  Has seen chiropractor and Ortho in the past.  Has done an x-ray and MRI of the neck.  Has tried gabapentin and steroids.  Will check B12 and vitamin D along with other electrolytes.  If all normal referred to neurology for EMG      Relevant Orders   CBC   Comprehensive metabolic panel   Vitamin B12   Family history of MS (multiple sclerosis)    Paresthesias and tremor if all lab work is negative  referred to neurology for further workup.      Obesity (BMI 30-39.9)    Pending TSH, A1c, lipid panel.  Encourage healthy lifestyle modifications inclusive of diet and exercise.  Did review that the exercise recommendations are 30 minutes a day 5 times a week medically.      Relevant Orders   Hemoglobin A1c   Lipid panel   Former smoker    Urine microscopy for ruling out microscopic hematuria      Relevant Orders   Urine Microscopic   Other Visit Diagnoses     Need for Tdap vaccination       Relevant Orders   Tdap vaccine greater than or  equal to 7yo IM (Completed)   Screening for lipid disorders       Relevant Orders   Lipid panel       Return in about 1 year (around 05/18/2024) for CPE and Labs.   Audria Nine, NP

## 2023-05-19 NOTE — Assessment & Plan Note (Signed)
Discussed age-appropriate immunizations and screening exams.  Did review patient's personal, surgical, social, family histories.  Update tetanus vaccine today.  Patient up-to-date on all other age-appropriate vaccinations.  Patient is up-to-date on CRC screening, breast cancer screening, cervical cancer screening.  Patient was given information at discharge about preventative healthcare maintenance with anticipatory guidance.

## 2023-05-19 NOTE — Assessment & Plan Note (Signed)
Pending TSH, A1c, lipid panel.  Encourage healthy lifestyle modifications inclusive of diet and exercise.  Did review that the exercise recommendations are 30 minutes a day 5 times a week medically.

## 2023-05-19 NOTE — Assessment & Plan Note (Signed)
Continue on healthy lifestyle modifications.  Check vitamin B-12 and vitamin D

## 2023-05-19 NOTE — Patient Instructions (Signed)
Nice to see you today I will be in touch with the labs once I have reviewed them Follow up with me in 1 year, sooner if you need me  If labs are normal we will refer you to neurology  We did update your tetanus vaccine today

## 2023-05-19 NOTE — Assessment & Plan Note (Signed)
Bilateral upper extremities that is encouraging more more of the arm proximally.  Has seen chiropractor and Ortho in the past.  Has done an x-ray and MRI of the neck.  Has tried gabapentin and steroids.  Will check B12 and vitamin D along with other electrolytes.  If all normal referred to neurology for EMG

## 2023-05-20 LAB — COMPREHENSIVE METABOLIC PANEL
ALT: 10 U/L (ref 0–35)
AST: 13 U/L (ref 0–37)
Albumin: 4.3 g/dL (ref 3.5–5.2)
Alkaline Phosphatase: 68 U/L (ref 39–117)
BUN: 12 mg/dL (ref 6–23)
CO2: 31 mEq/L (ref 19–32)
Calcium: 9.3 mg/dL (ref 8.4–10.5)
Chloride: 102 meq/L (ref 96–112)
Creatinine, Ser: 0.94 mg/dL (ref 0.40–1.20)
GFR: 72.72 mL/min (ref >60.00)
Glucose, Bld: 62 mg/dL — ABNORMAL LOW (ref 70–99)
Potassium: 4.3 meq/L (ref 3.5–5.1)
Sodium: 140 meq/L (ref 135–145)
Total Bilirubin: 0.3 mg/dL (ref 0.2–1.2)
Total Protein: 6.8 g/dL (ref 6.0–8.3)

## 2023-05-20 LAB — CBC
HCT: 40.4 % (ref 36.0–46.0)
Hemoglobin: 13.1 g/dL (ref 12.0–15.0)
MCHC: 32.5 g/dL (ref 30.0–36.0)
MCV: 86 fl (ref 78.0–100.0)
Platelets: 334 10*3/uL (ref 150.0–400.0)
RBC: 4.7 Mil/uL (ref 3.87–5.11)
RDW: 13.5 % (ref 11.5–15.5)
WBC: 7.4 10*3/uL (ref 4.0–10.5)

## 2023-05-20 LAB — LIPID PANEL
Cholesterol: 187 mg/dL (ref 0–200)
HDL: 66 mg/dL (ref >39.00)
LDL Cholesterol: 103 mg/dL — ABNORMAL HIGH (ref 0–99)
NonHDL: 121.29
Total CHOL/HDL Ratio: 3
Triglycerides: 91 mg/dL (ref 0.0–149.0)
VLDL: 18.2 mg/dL (ref 0.0–40.0)

## 2023-05-20 LAB — URINALYSIS, MICROSCOPIC ONLY

## 2023-05-20 LAB — TSH: TSH: 1.87 u[IU]/mL (ref 0.35–5.50)

## 2023-05-20 LAB — HEMOGLOBIN A1C: Hgb A1c MFr Bld: 5.4 % (ref 4.6–6.5)

## 2023-05-20 LAB — VITAMIN D 25 HYDROXY (VIT D DEFICIENCY, FRACTURES): VITD: 71.78 ng/mL (ref 30.00–100.00)

## 2023-05-20 LAB — VITAMIN B12: Vitamin B-12: 286 pg/mL (ref 211–911)

## 2023-05-23 ENCOUNTER — Telehealth: Payer: Self-pay | Admitting: Nurse Practitioner

## 2023-05-23 DIAGNOSIS — R202 Paresthesia of skin: Secondary | ICD-10-CM

## 2023-05-23 DIAGNOSIS — Z820 Family history of epilepsy and other diseases of the nervous system: Secondary | ICD-10-CM

## 2023-05-23 NOTE — Telephone Encounter (Signed)
Referral placed.

## 2023-05-23 NOTE — Telephone Encounter (Signed)
-----   Message from [facility] T sent at 05/23/2023  1:43 PM EDT ----- Called patient reviewed all information and repeated back to me. Will call if any questions.  She would like referral to [facility]. Advised if no call to set up appointment in 2 weeks to call office so we can follow up.

## 2023-07-21 ENCOUNTER — Encounter: Payer: Self-pay | Admitting: Neurology

## 2023-08-18 NOTE — Progress Notes (Signed)
Initial neurology clinic note  EVERLEE KEITER MRN: [id number] DOB: Oct 12, 1975  Referring provider: Eden Emms, NP  Primary care provider: Eden Emms, NP  Reason for consult:  headaches, paresthesia of bilateral upper limbs  Subjective:  This is Ms. KOREE WAYSON, a 47 y.o. right-handed female with a medical history of anxiety, depression, hx of Roux en Y surgery (~2014), migraines, GERD who presents to neurology clinic with headaches and paresthesia of bilateral upper limbs. The patient is alone today.  Around 2004, patient was seen by [facility] for migraines. She was on Topamax and Flexeril. She thinks this worked. She got pregnant and stopped topamax in 2007 and has not been on migraine medication since. She currently 2-3 headaches per week. She has pain behind her eyes. It can be one sided and cover her head. She used to have photophobia, phonophobia, and nausea, but not recently. She denies aura. She cannot take NSAIDs but takes tylenol occasionally, but it does not work. Her headaches can last 6-8 hours or last 2 days. She endorses a long history of depression and anxiety. She sleeps around 8 hours. She does not think she snores. She feels well rested when she wakes. She denies neck pain.  Her arms started bothering her about 2 years ago. It started in her left upper arm with pain. She thought she pulled a muscle, but this pain continued. A few months later, her right arm started hurting. It felt like a muscle soreness. She has numbness and tingling in bilateral hands for a while (4th and 5th digits). The arm pain comes and goes, lasting a few weeks, then gone again. They can feel "heavy and tired." She has seen a chiropractor in the past. She did 3 months without significant symptoms. MRI cervical spine showed no significant pathology. She has seen ortho who gave her gabapentin 100 mg BID, steroids, and referred to pain management. She did not find significant  relief.  She has never had an EMG.  Patient has a family history of MS (brother). This is a concern for her. Her brother noticed a tremor while she was cutting cake, and thought this could be MS.  Of note, patient is on bupropion, prozac for depression.  Caffeine use: 6 cups of diet green tea per day, occasional coffee EtOH use: none since 2017; previously heavier drinker Former smoker: quit over 20 years ago Family history of neurologic disease: MS in brother, dad has migraines  MEDICATIONS:  Outpatient Encounter Medications as of 09/01/2023  Medication Sig Note   buPROPion (WELLBUTRIN XL) 150 MG 24 hr tablet Take 150-300 mg by mouth 2 (two) times daily. 300 mg in the am and 150 mg in the evening 05/03/2015: Received from: External Pharmacy Received Sig:    FLUoxetine (PROZAC) 20 MG capsule Take 40 mg by mouth daily.     naltrexone (DEPADE) 50 MG tablet Take 50 mg by mouth daily.    SUMAtriptan (IMITREX) 100 MG tablet Take 1 tablet (100 mg total) by mouth as needed for migraine. May repeat in 2 hours if headache persists or recurs.    topiramate (TOPAMAX) 25 MG tablet Take 1 tablet (25 mg total) by mouth daily.    No facility-administered encounter medications on file as of 09/01/2023.    PAST MEDICAL HISTORY: Past Medical History:  Diagnosis Date   Depression    GERD (gastroesophageal reflux disease)    Hyperlipidemia    TG - 178 (07/2012) per pt  Migraines    Morbid obesity (HCC)     PAST SURGICAL HISTORY: Past Surgical History:  Procedure Laterality Date   ESOPHAGOGASTRODUODENOSCOPY N/A 06/17/2015   Procedure: ESOPHAGOGASTRODUODENOSCOPY (EGD);  Surgeon: Gaynelle Adu, MD;  Location: [facility] ENDOSCOPY;  Service: General;  Laterality: N/A;   GASTRIC ROUX-EN-Y  10/16/2012   Procedure: LAPAROSCOPIC ROUX-EN-Y GASTRIC;  Surgeon: Atilano Ina, MD,FACS;  Location: [facility] ORS;  Service: General;  Laterality: N/A;   gastric ulcer     repair   LAPAROSCOPIC CHOLECYSTECTOMY  09/27/1998    TUBAL LIGATION  07/28/2008   UPPER GI ENDOSCOPY  10/16/2012   Procedure: UPPER GI ENDOSCOPY;  Surgeon: Atilano Ina, MD,FACS;  Location: [facility] ORS;  Service: General;;    ALLERGIES: Allergies  Allergen Reactions   Nsaids Other (See Comments)    Gets ulcers     FAMILY HISTORY: Family History  Problem Relation Age of Onset   Breast cancer Mother 43   Cancer Mother        breast   Colon cancer Paternal Aunt    Cancer Paternal Aunt        colon   Bladder Cancer Paternal Aunt    Cancer Paternal Aunt        colon   Kidney cancer Maternal Uncle    Heart disease Father     SOCIAL HISTORY: Social History   Tobacco Use   Smoking status: Former    Current packs/day: 0.00    Types: Cigarettes    Quit date: 12/27/2003    Years since quitting: 19.6   Smokeless tobacco: Never  Vaping Use   Vaping status: Never Used  Substance Use Topics   Alcohol use: Not Currently    Comment: once a month   Drug use: No   Social History   Social History Narrative   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 Right   Are you currently employed ? 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 husband   What type of home do you live in: 1 story or 2 story? one    Caffeine one time a month soda    Objective:  Vital Signs:  BP (!) 142/88   Pulse 88   Ht 5\' 6"  (1.676 m)   Wt 212 lb (96.2 kg)   SpO2 98%   BMI 34.22 kg/m   General: No acute distress.  Patient appears well-groomed.   Head:  Normocephalic/atraumatic Eyes:  fundi examined, disc margins clear, no obvious papilledema Neck: supple, no paraspinal tenderness, full range of motion Back: No paraspinal tenderness Heart: regular, tachycardia Lungs: Clear to auscultation bilaterally. Vascular: No carotid bruits.  Neurological Exam: Mental status: alert and oriented, speech fluent and not dysarthric,  language intact.  Cranial nerves: CN I: not tested CN II: pupils equal, round and reactive to light, visual fields intact CN III, IV, VI:  full range of motion, no nystagmus, no ptosis CN V: facial sensation intact. CN VII: upper and lower face symmetric CN VIII: hearing intact CN IX, X: uvula midline CN XI: sternocleidomastoid and trapezius muscles intact CN XII: tongue midline  Bulk & Tone: normal. Motor:  muscle strength 5/5 throughout Deep Tendon Reflexes:  1+ throughout.   Sensation:  Pinprick sensation diminished in bilateral lateral forearms, otherwise intact.  Finger to nose testing:  Without dysmetria. Intention tremor bilaterally Gait:  Normal station and stride.  Romberg negative.   Labs and Imaging review: Internal labs: Lab Results  Component Value Date   HGBA1C 5.4 05/19/2023   Lab Results  Component Value Date   VITAMINB12 286 05/19/2023   Lab Results  Component Value Date   TSH 1.87 05/19/2023   05/19/23: CMP unremarkable CBC unremarkable Lipid panel: Component     Latest Ref Rng 05/19/2023  Cholesterol     0 - 200 mg/dL 981   Triglycerides     0.0 - 149.0 mg/dL 19.1   HDL Cholesterol     >39.00 mg/dL 47.82   VLDL     0.0 - 40.0 mg/dL 95.6   LDL (calc)     0 - 99 mg/dL 213 (H)   Total CHOL/HDL Ratio 3   NonHDL 121.29     Imaging: MRI cervical spine wo contrast (08/05/2022): FINDINGS: Alignment: Normal   Vertebrae: No fracture or focal bone lesion.   Cord: No cord compression or focal cord lesion.   Posterior Fossa, vertebral arteries, paraspinal tissues: Negative   Disc levels:   The foramen magnum is widely patent. There is ordinary mild osteoarthritis of the C1-2 articulation but no encroachment upon the neural structures.   C2-3: Normal   C3-4: Minimal disc bulge.  No stenosis.   C4-5: Normal interspace.   C5-6: Mild bulging of the disc. Slight indentation of the ventral subarachnoid space but no compressive effect upon the  cord or nerve roots.   C6-7: Mild annular bulging. Slight indentation of the ventral subarachnoid space but no compressive effect upon the cord or nerve roots.   C7-T1: Normal interspace.   IMPRESSION: No likely significant finding. Mild non-compressive disc bulges at C3-4, C5-6 and C6-7.  Assessment/Plan:  DALENE WORM is a 47 y.o. female who presents for evaluation of headaches and pain in arms. She has a relevant medical history of anxiety, depression, hx of Roux en Y surgery (~2014), migraines, GERD. Her neurological examination is pertinent for diminished sensation in the lateral aspect of forearms bilaterally. Available diagnostic data is significant for borderline low B12 (286). 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 She has 8-15 headaches per month currently. She was previously on Topamax, which she thinks worked. She also appears to have essential tremor, which is currently not bothersome, but if it became so, topamax at higher doses could also treat ET. Regarding her arm pain, the etiology is unclear. It has long been suspected that symptoms are coming from her neck despite a negative MRI cervical spine in 2023. It could be cervical radiculopathy (?C5) or not nerve related and a chronic pain syndrome such as fibromyalgia. I will get labs and EMG to further clarify.   PLAN: -Blood work: B1, B6, vit D, CK -EMG bilateral upper extremities For migraines: Migraine prevention:  Start Topamax 25 mg daily Migraine rescue:  Start Sumatriptan 100 mg as needed at onset  Limit use of pain relievers to no more than 2 days out of week to prevent risk of rebound or medication-overuse headache. Keep headache diary  -B12 supplementation 1000 mcg daily (borderline low B12)  -Return to clinic in 3 months  The impression above as well as the plan as outlined below were extensively discussed with the patient who voiced understanding. All questions were  answered to their satisfaction.  When available, results of the above investigations and possible further recommendations will be  communicated to the patient via telephone/MyChart. Patient to call office if not contacted after expected testing turnaround time.   Total time spent reviewing records, interview, history/exam, documentation, and coordination of care on day of encounter:  50 min   Thank you for allowing me to participate in patient's care.  If I can answer any additional questions, I would be pleased to do so.  Jacquelyne Balint, MD   CC: Toney Reil Genene Churn, NP [address] [place] [address]  CC: Referring provider: Eden Emms, NP [address] [place],  [place] [address]

## 2023-09-01 ENCOUNTER — Other Ambulatory Visit: Payer: No Typology Code available for payment source

## 2023-09-01 ENCOUNTER — Encounter: Payer: Self-pay | Admitting: Neurology

## 2023-09-01 ENCOUNTER — Ambulatory Visit: Payer: No Typology Code available for payment source | Admitting: Neurology

## 2023-09-01 VITALS — BP 142/88 | HR 88 | Ht 66.0 in | Wt 212.0 lb

## 2023-09-01 DIAGNOSIS — M79601 Pain in right arm: Secondary | ICD-10-CM | POA: Diagnosis not present

## 2023-09-01 DIAGNOSIS — R209 Unspecified disturbances of skin sensation: Secondary | ICD-10-CM | POA: Diagnosis not present

## 2023-09-01 DIAGNOSIS — R251 Tremor, unspecified: Secondary | ICD-10-CM | POA: Diagnosis not present

## 2023-09-01 DIAGNOSIS — E538 Deficiency of other specified B group vitamins: Secondary | ICD-10-CM

## 2023-09-01 DIAGNOSIS — G43009 Migraine without aura, not intractable, without status migrainosus: Secondary | ICD-10-CM | POA: Diagnosis not present

## 2023-09-01 DIAGNOSIS — M79602 Pain in left arm: Secondary | ICD-10-CM

## 2023-09-01 MED ORDER — SUMATRIPTAN SUCCINATE 100 MG PO TABS: 100.0000 mg | ORAL_TABLET | ORAL | 5 refills | Status: DC | PRN

## 2023-09-01 MED ORDER — TOPIRAMATE 25 MG PO TABS: 25.0000 mg | ORAL_TABLET | Freq: Every day | ORAL | 5 refills | Status: DC

## 2023-09-01 NOTE — Patient Instructions (Addendum)
I saw you today for headaches and arm pain. Your headaches sound like migraines to me.   For migraines: Migraine prevention:  Start Topamax 25 mg daily Migraine rescue:  Start Sumatriptan 100 mg as needed at onset  Limit use of pain relievers to no more than 2 days out of week to prevent risk of rebound or medication-overuse headache. Keep headache diary  Your arm pain is less clear. It could be related to your neck (pinched nerves) or be something else like muscle pain from something like fibromyalgia.  I would like to get lab work today and a muscle and nerve test on both arms called an EMG (see more information below).  I will be in touch when I have your results.  Your B12 was borderline low. Given your symptoms, I would recommend supplementing with B12 1000 mcg daily. This can be bought over the counter at any local drug store or online.   I will see you back in clinic in about 3 months. Please let me know if you have any questions or concerns in the meantime.   The physicians and staff at [facility] are committed to providing excellent care. You may receive a survey requesting feedback about your experience at our office. We strive to receive "very good" responses to the survey questions. If you feel that your experience would prevent you from giving the office a "very good " response, please contact our office to try to remedy the situation. We may be reached at [phone]. Thank you for taking the time out of your busy day to complete the survey.  Jacquelyne Balint, MD [facility]  ELECTROMYOGRAM AND NERVE CONDUCTION STUDIES (EMG/NCS) INSTRUCTIONS  How to Prepare The neurologist conducting the EMG will need to know if you have certain medical conditions. Tell the neurologist and other EMG lab personnel if you: Have a pacemaker or any other electrical medical device Take blood-thinning medications Have hemophilia, a blood-clotting disorder that causes prolonged  bleeding Bathing Take a shower or bath shortly before your exam in order to remove oils from your skin. Don't apply lotions or creams before the exam.  What to Expect You'll likely be asked to change into a hospital gown for the procedure and lie down on an examination table. The following explanations can help you understand what will happen during the exam.  Electrodes. The neurologist or a technician places surface electrodes at various locations on your skin depending on where you're experiencing symptoms. Or the neurologist may insert needle electrodes at different sites depending on your symptoms.  Sensations. The electrodes will at times transmit a tiny electrical current that you may feel as a twinge or spasm. The needle electrode may cause discomfort or pain that usually ends shortly after the needle is removed. If you are concerned about discomfort or pain, you may want to talk to the neurologist about taking a short break during the exam.  Instructions. During the needle EMG, the neurologist will assess whether there is any spontaneous electrical activity when the muscle is at rest - activity that isn't present in healthy muscle tissue - and the degree of activity when you slightly contract the muscle.  He or she will give you instructions on resting and contracting a muscle at appropriate times. Depending on what muscles and nerves the neurologist is examining, he or she may ask you to change positions during the exam.  After your EMG You may experience some temporary, minor bruising where the needle electrode was  inserted into your muscle. This bruising should fade within several days. If it persists, contact your primary care doctor.

## 2023-09-05 LAB — VITAMIN B6: Vitamin B6: 5.1 ng/mL (ref 2.1–21.7)

## 2023-09-05 LAB — CK: Total CK: 38 U/L (ref 29–143)

## 2023-09-05 LAB — VITAMIN B1: Vitamin B1 (Thiamine): 9 nmol/L (ref 8–30)

## 2023-09-05 LAB — VITAMIN D 25 HYDROXY (VIT D DEFICIENCY, FRACTURES): Vit D, 25-Hydroxy: 39 ng/mL (ref 30–100)

## 2023-10-04 ENCOUNTER — Ambulatory Visit: Payer: No Typology Code available for payment source | Admitting: Neurology

## 2023-10-04 ENCOUNTER — Other Ambulatory Visit: Payer: Self-pay | Admitting: Neurology

## 2023-10-04 ENCOUNTER — Telehealth: Payer: Self-pay | Admitting: Neurology

## 2023-10-04 DIAGNOSIS — G43009 Migraine without aura, not intractable, without status migrainosus: Secondary | ICD-10-CM

## 2023-10-04 DIAGNOSIS — R251 Tremor, unspecified: Secondary | ICD-10-CM

## 2023-10-04 DIAGNOSIS — R209 Unspecified disturbances of skin sensation: Secondary | ICD-10-CM

## 2023-10-04 DIAGNOSIS — M79601 Pain in right arm: Secondary | ICD-10-CM

## 2023-10-04 DIAGNOSIS — E538 Deficiency of other specified B group vitamins: Secondary | ICD-10-CM

## 2023-10-04 MED ORDER — TOPIRAMATE 25 MG PO TABS: 50.0000 mg | ORAL_TABLET | Freq: Every day | ORAL | 5 refills | Status: DC

## 2023-10-04 NOTE — Telephone Encounter (Signed)
 Discussed the results of patient's EMG after the procedure today. EMG was normal in both arms with no evidence of nerve pathology causing her symptoms in the arms. This could be MSK or a chronic pain syndrome such as fibromyalgia.  In terms of her headaches, patient has started to see mild improvement of headaches over the last week with topamax  25 mg daily. She gets relief with Sumatriptan , but is using it all each month. She thinks she still had about 15 headaches in the last month.  Due to this, we agreed to increase topamax  to 50 mg daily. She will call if headaches are not improving. She will follow up as planned in 12/2023.  All questions were answered.  Sheila Potters, MD [facility]

## 2023-10-04 NOTE — Procedures (Signed)
 [facility]  [address] [place], [address]  [place], [place] [address] Tel: [phone] Fax: [phone] Test Date:  10/04/2023  Patient: Sheila Rodriguez DOB: Jan 31, 1976 Physician: Venetia Potters, MD  Sex: Female Height: 5' 6 Ref Phys: Venetia Potters, MD  ID#: [id number]   Technician:    History: This is a 48 year old female with bilateral arm pain.  NCV & EMG Findings: Extensive electrodiagnostic evaluation of bilateral upper limbs shows: Bilateral median, ulnar, radial, and medial-ulnar palmar sensory responses are within normal limits. Bilateral median (APB) and ulnar (ADM) motor responses are within normal limits. There is no evidence of active or chronic motor axon loss changes affecting any of the tested muscles on needle examination. Motor unit configuration and recruitment pattern is within normal limits.  Impression: This is a normal study. Specifically: No electrodiagnostic evidence of a right or left cervical (C5-C8) motor radiculopathy. No electrodiagnostic evidence of a right or left median mononeuropathy at or distal to the wrist (ie: carpal tunnel syndrome). Screening studies for right or left ulnar or radial mononeuropathies are normal.     ___________________________ Venetia Potters, MD    Nerve Conduction Studies Motor Nerve Results    Latency Amplitude F-Lat Segment Distance CV Comment  Site (ms) Norm (mV) Norm (ms)  (cm) (m/s) Norm   Left Median (APB) Motor  Wrist 2.4  < 3.9 7.4  > 6.0        Elbow 6.3 - 7.4 -  Elbow-Wrist 25 64  > 50   Right Median (APB) Motor  Wrist 2.3  < 3.9 8.2  > 6.0        Elbow 6.6 - 8.2 -  Elbow-Wrist 26 60  > 50   Left Ulnar (ADM) Motor  Wrist 1.98  < 3.1 12.1  > 7.0        Bel elbow 5.1 - 11.1 -  Bel elbow-Wrist 20 65  > 50   Ab elbow 6.6 - 11.0 -  Ab elbow-Bel elbow 10 67 -   Right Ulnar (ADM) Motor  Wrist 1.63  < 3.1 12.5  > 7.0        Bel elbow 4.5 - 11.3 -  Bel elbow-Wrist 20 69  > 50   Ab elbow 6.1 - 10.8 -  Ab  elbow-Bel elbow 10 63 -    Sensory Sites    Neg Peak Lat Amplitude (O-P) Segment Distance Velocity Comment  Site (ms) Norm (V) Norm  (cm) (ms)   Left Median Sensory  Wrist-Dig II 2.9  < 3.4 65  > 20 Wrist-Dig II 13    Right Median Sensory  Wrist-Dig II 2.7  < 3.4 64  > 20 Wrist-Dig II 13    Left Median-Ulnar Palmar Sensory       Median  Palm-Wrist 1.75  < 2.2 94  > 10 Palm-Wrist 8         Ulnar  Palm-Wrist 1.93  < 2.2 28  > 5 Palm-Wrist 8    Right Median-Ulnar Palmar Sensory       Median  Palm-Wrist 1.68  < 2.2 118  > 10 Palm-Wrist 8         Ulnar  Palm-Wrist 1.63  < 2.2 44  > 5 Palm-Wrist 8    Left Radial Sensory  Forearm-Wrist 1.88  < 2.7 38  > 18 Forearm-Wrist 10    Right Radial Sensory  Forearm-Wrist 2.1  < 2.7 24  > 18 Forearm-Wrist 10    Left Ulnar Sensory  Wrist-Dig V 2.9  < 3.1 52  > 12 Wrist-Dig V 11    Right Ulnar Sensory  Wrist-Dig V 2.4  < 3.1 57  > 12 Wrist-Dig V 11     Inter-Nerve Comparisons   Nerve 1 Value 1 Nerve 2 Value 2 Parameter Result Normal  Sensory Sites  R Median Palm-Wrist 1.68 ms R Ulnar Palm-Wrist 1.63 ms Peak Lat Diff 0.05 ms <0.40  L Median Palm-Wrist 1.75 ms L Ulnar Palm-Wrist 1.93 ms Peak Lat Diff 0.18 ms <0.40   Electromyography   Side Muscle Ins.Act Fibs Fasc Recrt Amp Dur Poly Activation Comment  Right FDI Nml Nml Nml Nml Nml Nml Nml Nml N/A  Right EIP Nml Nml Nml Nml Nml Nml Nml Nml N/A  Right Pronator teres Nml Nml Nml Nml Nml Nml Nml Nml N/A  Right Biceps Nml Nml Nml Nml Nml Nml Nml Nml N/A  Right Triceps Nml Nml Nml Nml Nml Nml Nml Nml N/A  Right Deltoid Nml Nml Nml Nml Nml Nml Nml Nml N/A  Left FDI Nml Nml Nml Nml Nml Nml Nml Nml N/A  Left EIP Nml Nml Nml Nml Nml Nml Nml Nml N/A  Left Pronator teres Nml Nml Nml Nml Nml Nml Nml Nml N/A  Left Biceps Nml Nml Nml Nml Nml Nml Nml Nml N/A  Left Triceps Nml Nml Nml Nml Nml Nml Nml Nml N/A  Left Deltoid Nml Nml Nml Nml Nml Nml Nml Nml N/A      Waveforms:  Motor            Sensory

## 2023-10-06 ENCOUNTER — Encounter: Payer: Self-pay | Admitting: Neurology

## 2023-12-01 ENCOUNTER — Other Ambulatory Visit: Payer: Self-pay | Admitting: Neurology

## 2023-12-01 ENCOUNTER — Encounter: Payer: Self-pay | Admitting: Neurology

## 2023-12-01 DIAGNOSIS — R251 Tremor, unspecified: Secondary | ICD-10-CM

## 2023-12-01 DIAGNOSIS — G43009 Migraine without aura, not intractable, without status migrainosus: Secondary | ICD-10-CM

## 2023-12-01 MED ORDER — TOPIRAMATE 25 MG PO TABS: 75.0000 mg | ORAL_TABLET | Freq: Every day | ORAL | 5 refills | Status: DC

## 2023-12-02 ENCOUNTER — Other Ambulatory Visit: Payer: Self-pay

## 2023-12-02 DIAGNOSIS — G43009 Migraine without aura, not intractable, without status migrainosus: Secondary | ICD-10-CM

## 2023-12-21 ENCOUNTER — Ambulatory Visit: Payer: No Typology Code available for payment source | Admitting: Nurse Practitioner

## 2023-12-22 ENCOUNTER — Ambulatory Visit
Admission: RE | Admit: 2023-12-22 | Discharge: 2023-12-22 | Disposition: A | Discharge status: Home / Self Care | Source: Ambulatory Visit | Attending: Neurology | Admitting: Neurology

## 2023-12-22 DIAGNOSIS — G43009 Migraine without aura, not intractable, without status migrainosus: Secondary | ICD-10-CM

## 2023-12-22 MED ORDER — GADOPICLENOL 0.5 MMOL/ML IV SOLN
10.0000 mL | Freq: Once | INTRAVENOUS | Status: AC | PRN
  Administered 2023-12-22: 10 mL via INTRAVENOUS

## 2023-12-29 NOTE — Progress Notes (Signed)
 NEUROLOGY FOLLOW UP OFFICE NOTE  BUFFIE HERNE [id number]  Subjective:  Sheila Rodriguez is a 48 y.o. year old right-handed female with a medical history of anxiety, depression, hx of Roux en Y surgery (~2014), migraines, GERD who we last saw on 09/01/23 for headaches and paresthesia of bilateral upper limbs.  To briefly review: 09/01/23: Around 2004, patient was seen by [facility] for migraines. She was on Topamax and Flexeril. She thinks this worked. She got pregnant and stopped topamax in 2007 and has not been on migraine medication since. She currently 2-3 headaches per week. She has pain behind her eyes. It can be one sided and cover her head. She used to have photophobia, phonophobia, and nausea, but not recently. She denies aura. She cannot take NSAIDs but takes tylenol occasionally, but it does not work. Her headaches can last 6-8 hours or last 2 days. She endorses a long history of depression and anxiety. She sleeps around 8 hours. She does not think she snores. She feels well rested when she wakes. She denies neck pain.   Her arms started bothering her about 2 years ago. It started in her left upper arm with pain. She thought she pulled a muscle, but this pain continued. A few months later, her right arm started hurting. It felt like a muscle soreness. She has numbness and tingling in bilateral hands for a while (4th and 5th digits). The arm pain comes and goes, lasting a few weeks, then gone again. They can feel "heavy and tired." She has seen a chiropractor in the past. She did 3 months without significant symptoms. MRI cervical spine showed no significant pathology. She has seen ortho who gave her gabapentin 100 mg BID, steroids, and referred to pain management. She did not find significant relief.   She has never had an EMG.   Patient has a family history of MS (brother). This is a concern for her. Her brother noticed a tremor while she was cutting cake, and  thought this could be MS.   Of note, patient is on bupropion, prozac for depression.   Caffeine use: 6 cups of diet green tea per day, occasional coffee EtOH use: none since 2017; previously heavier drinker Former smoker: quit over 20 years ago Family history of neurologic disease: MS in brother, dad has migraines  Most recent Assessment and Plan (09/01/23): Sheila Rodriguez is a 48 y.o. female who presents for evaluation of headaches and pain in arms. She has a relevant medical history of anxiety, depression, hx of Roux en Y surgery (~2014), migraines, GERD. Her neurological examination is pertinent for diminished sensation in the lateral aspect of forearms bilaterally. Available diagnostic data is significant for borderline low B12 (286). 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 She has 8-15 headaches per month currently. She was previously on Topamax, which she thinks worked. She also appears to have essential tremor, which is currently not bothersome, but if it became so, topamax at higher doses could also treat ET. Regarding her arm pain, the etiology is unclear. It has long been suspected that symptoms are coming from her neck despite a negative MRI cervical spine in 2023. It could be cervical radiculopathy (?C5) or not nerve related and a chronic pain syndrome such as fibromyalgia. I will get labs and EMG to further clarify.     PLAN: -Blood work: B1, B6, vit D, CK -EMG bilateral upper extremities For migraines: Migraine prevention:  Start Topamax 25  mg daily Migraine rescue:  Start Sumatriptan 100 mg as needed at onset  Limit use of pain relievers to no more than 2 days out of week to prevent risk of rebound or medication-overuse headache. Keep headache diary   -B12 supplementation 1000 mcg daily (borderline low B12)  Since their last visit: Labs were normal. EMG of bilateral upper extremities on 10/04/23 was normal. Patient mentioned she was  seeing mild improvement of HA with topamax and responding to Sumatriptan. She still had about 15 headaches per month at that time, so I increased the topamax to 50 mg daily on 10/04/23. In 10/2023, she had about 12 HA days. I increased her Topamax to 75 mg daily on 12/01/23. I also ordered MRI brain that did not show any significant abnormalities. There were a couple of white matter changes that can be seen in patients with migraines. She is still taking Topamax 75 mg daily.  In 11/2023 she had 10 headache days. In 12/2023 so far she has had 5 headaches. She is reporting tightness in her chest when taking Sumatriptan. It does not always get rid of her headache. She also has tried Rizatriptan in the past and it also caused chest pain.  She still has pain in her arms, but it comes and goes (left arm worse than right). Her arms sometimes feel heavy. She will notice discoloration of her fingers, red, white, or blue, especially when cold.  Her tremor is about the same as prior.  She continues to take B12 supplementation.  MEDICATIONS:  Outpatient Encounter Medications as of 01/06/2024  Medication Sig Note   buPROPion (WELLBUTRIN XL) 150 MG 24 hr tablet Take 150-300 mg by mouth 2 (two) times daily. 300 mg in the am and 150 mg in the evening 05/03/2015: Received from: External Pharmacy Received Sig:    FLUoxetine (PROZAC) 20 MG capsule Take 40 mg by mouth daily.     naltrexone (DEPADE) 50 MG tablet Take 50 mg by mouth daily.    SUMAtriptan (IMITREX) 100 MG tablet Take 1 tablet (100 mg total) by mouth as needed for migraine. May repeat in 2 hours if headache persists or recurs.    topiramate (TOPAMAX) 25 MG tablet Take 3 tablets (75 mg total) by mouth daily.    No facility-administered encounter medications on file as of 01/06/2024.    PAST MEDICAL HISTORY: Past Medical History:  Diagnosis Date   Depression    GERD (gastroesophageal reflux disease)    Hyperlipidemia    TG - 178 (07/2012) per pt   Migraines     Morbid obesity (HCC)     PAST SURGICAL HISTORY: Past Surgical History:  Procedure Laterality Date   ESOPHAGOGASTRODUODENOSCOPY N/A 06/17/2015   Procedure: ESOPHAGOGASTRODUODENOSCOPY (EGD);  Surgeon: Gaynelle Adu, MD;  Location: [facility] ENDOSCOPY;  Service: General;  Laterality: N/A;   GASTRIC ROUX-EN-Y  10/16/2012   Procedure: LAPAROSCOPIC ROUX-EN-Y GASTRIC;  Surgeon: Atilano Ina, MD,FACS;  Location: [facility] ORS;  Service: General;  Laterality: N/A;   gastric ulcer     repair   LAPAROSCOPIC CHOLECYSTECTOMY  09/27/1998   TUBAL LIGATION  07/28/2008   UPPER GI ENDOSCOPY  10/16/2012   Procedure: UPPER GI ENDOSCOPY;  Surgeon: Atilano Ina, MD,FACS;  Location: [facility] ORS;  Service: General;;    ALLERGIES: Allergies  Allergen Reactions   Nsaids Other (See Comments)    Gets ulcers     FAMILY HISTORY: Family History  Problem Relation Age of Onset   Breast cancer Mother 23   Cancer  Mother        breast   Colon cancer Paternal Aunt    Cancer Paternal Aunt        colon   Bladder Cancer Paternal Aunt    Cancer Paternal Aunt        colon   Kidney cancer Maternal Uncle    Heart disease Father     SOCIAL HISTORY: Social History   Tobacco Use   Smoking status: Former    Current packs/day: 0.00    Types: Cigarettes    Quit date: 12/27/2003    Years since quitting: 20.0   Smokeless tobacco: Never  Vaping Use   Vaping status: Never Used  Substance Use Topics   Alcohol use: Not Currently    Comment: once a month   Drug use: No   Social History   Social History Narrative   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 Right   Are you currently employed ? 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 husband   What type of home do you live in: 1 story or 2 story? one    Caffeine one time a month soda      Objective:  Vital Signs:  BP 125/83   Pulse  92   Ht 5\' 6"  (1.676 m)   Wt 201 lb (91.2 kg)   SpO2 100%   BMI 32.44 kg/m   General: No acute distress.  Patient appears well-groomed.   Head:  Normocephalic/atraumatic Eyes:  fundi examined, disc margins clear, no obvious papilledema Neck: supple Lungs: Non-labored breathing on room air   Neurological Exam: Mental status: alert and oriented, speech fluent and not dysarthric, language intact.  Cranial nerves: CN I: not tested CN II: pupils equal, round and reactive to light, visual fields intact CN III, IV, VI:  full range of motion, no nystagmus, no ptosis CN V: facial sensation intact. CN VII: upper and lower face symmetric CN VIII: hearing intact CN IX, X: uvula midline CN XI: sternocleidomastoid and trapezius muscles intact CN XII: tongue midline  Bulk & Tone: normal, no fasciculations. Motor:  muscle strength 5/5 throughout Deep Tendon Reflexes:  2+ throughout.   Sensation:  Light touch sensation intact. Finger to nose testing:  Without dysmetria.     Gait:  Normal station and stride.  Romberg negative.   Labs and Imaging review: New results: 09/01/23: CK 38 Vit D wnl B6 wnl B1 wnl  EMG (10/04/23): NCV & EMG Findings: Extensive electrodiagnostic evaluation of bilateral upper limbs shows: Bilateral median, ulnar, radial, and medial-ulnar palmar sensory responses are within normal limits. Bilateral median (APB) and ulnar (ADM) motor responses are within normal limits. There is no evidence of active or chronic motor axon loss changes affecting any of the tested muscles on needle examination. Motor unit configuration and recruitment pattern is within normal limits.   Impression: This is a normal study. Specifically: No electrodiagnostic evidence of a right or left cervical (C5-C8) motor radiculopathy. No electrodiagnostic evidence of a right or left median mononeuropathy at or distal to the wrist (ie: carpal tunnel syndrome). Screening studies for right or left  ulnar or radial mononeuropathies are normal.  MRI brain w/wo contrast (12/22/23): FINDINGS: Brain:   No age-advanced or lobar predominant cerebral atrophy.  7 mm focus of T2 FLAIR hyperintense signal abnormality within the anterior left frontal lobe white matter (series 112, image 23). Several smaller foci of T2 FLAIR hyperintense signal abnormality scattered elsewhere within the bilateral cerebral white matter.   No cortical encephalomalacia is identified.   There is no acute infarct.   No evidence of an intracranial mass.   No chronic intracranial blood products.   No extra-axial fluid collection.   No midline shift.   No pathologic intracranial enhancement identified.   Vascular: Maintained flow voids within the proximal large arterial vessels.   Skull and upper cervical spine: No focal worrisome marrow lesion.   Sinuses/Orbits: No mass or acute finding within the imaged orbits. No significant paranasal sinus disease.   IMPRESSION: 1.  No evidence of an acute intracranial abnormality. 2. Several foci of T2 FLAIR hyperintense signal abnormality scattered within the cerebral white matter, the largest within the anterior left frontal lobe. These signal changes are nonspecific and differential considerations include chronic small vessel ischemic disease, sequelae of chronic migraine headaches, sequelae of a demyelinating process and sequelae of a prior infectious/inflammatory process, among others. 3. Otherwise unremarkable MRI appearance of the brain.  Previously reviewed results: Lab Results  Component Value Date    HGBA1C 5.4 05/19/2023      Recent Labs       Lab Results  Component Value Date    VITAMINB12 286 05/19/2023      Recent Labs[] Expand by Default       Lab Results  Component Value Date    TSH 1.87 05/19/2023      05/19/23: CMP unremarkable CBC unremarkable Lipid panel: Component     Latest Ref Rng 05/19/2023  Cholesterol     0 - 200  mg/dL 914   Triglycerides     0.0 - 149.0 mg/dL 78.2   HDL Cholesterol     >39.00 mg/dL 95.62   VLDL     0.0 - 40.0 mg/dL 13.0   LDL (calc)     0 - 99 mg/dL 865 (H)   Total CHOL/HDL Ratio 3   NonHDL 121.29     MRI cervical spine wo contrast (08/05/2022): FINDINGS: Alignment: Normal   Vertebrae: No fracture or focal bone lesion.   Cord: No cord compression or focal cord lesion.   Posterior Fossa, vertebral arteries, paraspinal tissues: Negative   Disc levels:   The foramen magnum is widely patent. There is ordinary mild osteoarthritis of the C1-2 articulation but no encroachment upon the neural structures.   C2-3: Normal   C3-4: Minimal disc bulge.  No stenosis.   C4-5: Normal interspace.   C5-6: Mild bulging of the disc. Slight indentation of the ventral subarachnoid space but no compressive effect upon the cord or nerve roots.   C6-7: Mild annular bulging. Slight indentation of the ventral subarachnoid space but no compressive effect upon the cord or nerve roots.   C7-T1: Normal interspace.   IMPRESSION: No likely significant finding. Mild non-compressive disc bulges at C3-4, C5-6 and C6-7.  Assessment/Plan:  This is [employer], a 48 y.o. female with headaches, pain in arms, and tremor. 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 She was previously on Topamax which worked well. She is currently on 75 mg daily and having ~10 headaches per month (down from ~12), so there is room for improvement. She is not tolerating sumatriptan well as it is causing chest pain. She is also not getting consistent relief with it, so does not always  take it. She previously tried rizatriptan and also got chest pain with it. Triptans would be contraindicated going forward due to this.  In terms of tremor, it is very mild, but looks consistent with essential tremor. It is not currently bothersome enough for treatment, but topamax can help at higher  doses.  Regarding her arm pain, EMG was normal. The etiology is not clear. Patient is worried about MS, as her brother has it, but I don't have clear evidence for this in her. Her MRI showed only mild white matter changes which look more consistent with chronic migraine changes.  Plan: Migraine prevention:  Increase Topamax to 50 mg BID Migraine rescue:  Stop sumatriptan due to chest pain. Start Ubrelvy 100 mg as needed at headache onset Limit use of pain relievers to no more than 2 days out of week to prevent risk of rebound or medication-overuse headache. Keep headache diary  Return to clinic in 6 months  Total time spent reviewing records, interview, history/exam, documentation, and coordination of care on day of encounter:  40 min  Jacquelyne Balint, MD

## 2024-01-04 ENCOUNTER — Other Ambulatory Visit

## 2024-01-06 ENCOUNTER — Telehealth: Payer: Self-pay

## 2024-01-06 ENCOUNTER — Encounter: Payer: Self-pay | Admitting: Neurology

## 2024-01-06 ENCOUNTER — Ambulatory Visit: Payer: No Typology Code available for payment source | Admitting: Neurology

## 2024-01-06 VITALS — BP 125/83 | HR 92 | Ht 66.0 in | Wt 201.0 lb

## 2024-01-06 DIAGNOSIS — G43009 Migraine without aura, not intractable, without status migrainosus: Secondary | ICD-10-CM

## 2024-01-06 DIAGNOSIS — R209 Unspecified disturbances of skin sensation: Secondary | ICD-10-CM

## 2024-01-06 DIAGNOSIS — M79601 Pain in right arm: Secondary | ICD-10-CM

## 2024-01-06 DIAGNOSIS — E538 Deficiency of other specified B group vitamins: Secondary | ICD-10-CM

## 2024-01-06 DIAGNOSIS — M79602 Pain in left arm: Secondary | ICD-10-CM

## 2024-01-06 DIAGNOSIS — R251 Tremor, unspecified: Secondary | ICD-10-CM

## 2024-01-06 MED ORDER — TOPIRAMATE 25 MG PO TABS: 50.0000 mg | ORAL_TABLET | Freq: Two times a day (BID) | ORAL | 5 refills | Status: DC

## 2024-01-06 MED ORDER — UBRELVY 100 MG PO TABS: 100.0000 mg | ORAL_TABLET | ORAL | 5 refills | Status: AC | PRN

## 2024-01-06 NOTE — Patient Instructions (Signed)
 Migraine prevention:  Increase Topamax to 50 mg BID Migraine rescue:  Stop sumatriptan due to chest pain. Start Ubrelvy 100 mg as needed at headache onset. I have to get this approved by insurance. This can take a month. I am providing samples today. Limit use of pain relievers to no more than 2 days out of week to prevent risk of rebound or medication-overuse headache. Keep headache diary  Return to clinic in 6 months  We can discuss repeating MRI if you continue to have concerns about MS. I would generally not recommend lumbar puncture if you don't absolutely need it, but I am willing to order if you want.  The physicians and staff at [facility] are committed to providing excellent care. You may receive a survey requesting feedback about your experience at our office. We strive to receive "very good" responses to the survey questions. If you feel that your experience would prevent you from giving the office a "very good " response, please contact our office to try to remedy the situation. We may be reached at [phone]. Thank you for taking the time out of your busy day to complete the survey.  Jacquelyne Balint, MD [facility]

## 2024-01-09 ENCOUNTER — Other Ambulatory Visit (HOSPITAL_COMMUNITY): Payer: Self-pay

## 2024-01-09 ENCOUNTER — Telehealth: Payer: Self-pay

## 2024-01-09 ENCOUNTER — Other Ambulatory Visit: Payer: Self-pay

## 2024-01-09 ENCOUNTER — Telehealth: Payer: Self-pay | Admitting: Pharmacy Technician

## 2024-01-09 ENCOUNTER — Encounter: Payer: Self-pay | Admitting: Neurology

## 2024-01-09 DIAGNOSIS — M79602 Pain in left arm: Secondary | ICD-10-CM

## 2024-01-09 DIAGNOSIS — E538 Deficiency of other specified B group vitamins: Secondary | ICD-10-CM

## 2024-01-09 DIAGNOSIS — R251 Tremor, unspecified: Secondary | ICD-10-CM

## 2024-01-09 DIAGNOSIS — R209 Unspecified disturbances of skin sensation: Secondary | ICD-10-CM

## 2024-01-09 DIAGNOSIS — R202 Paresthesia of skin: Secondary | ICD-10-CM

## 2024-01-09 DIAGNOSIS — G43009 Migraine without aura, not intractable, without status migrainosus: Secondary | ICD-10-CM

## 2024-01-09 NOTE — Telephone Encounter (Signed)
 Pharmacy Patient Advocate Encounter  Received notification from [facility] that Prior Authorization for UBRELVY 100MG  has been CANCELLED due to    LAST FILLED ON 4.11.25 AT [facility] [id number] PHONE# [phone]

## 2024-01-09 NOTE — Telephone Encounter (Signed)
 Done and sent to [facility]

## 2024-01-09 NOTE — Telephone Encounter (Signed)
 Pharmacy Patient Advocate Encounter   Received notification from Pt Calls Messages that prior authorization for UBRELVY 100MG  is required/requested.   Insurance verification completed.   The patient is insured through [facility] .   Per test claim: PA required; PA submitted to above mentioned insurance via CoverMyMeds Key/confirmation #/EOC [id number] Status is pending

## 2024-01-09 NOTE — Telephone Encounter (Signed)
-----   Message from Ellene Gustin sent at 01/09/2024  2:40 PM EDT ----- Regarding: RE: Lumbar puncture Paresthesias of bilateral upper extremities (or paresthesia of skin) whichever you have available.  Thank you. ----- Message ----- From: Arturo Bing, LPN Sent: 1/47/8295   2:29 PM EDT To: Ellene Gustin, MD Subject: FW: Lumbar puncture                            What is the main DX? ----- Message ----- From: Ellene Gustin, MD Sent: 01/09/2024   1:33 PM EDT To: Arturo Bing, LPN Subject: Lumbar puncture                                Sheila Rodriguez,  Can you order LP for patient?   Orders: -Cell count -protein -glucose -oligoclonal bands -IgG index  Thank you,  Sheila Rodriguez

## 2024-01-09 NOTE — Telephone Encounter (Signed)
 PA has been submitted, and telephone encounter has been created. Please see telephone encounter dated 4.14.25.

## 2024-01-10 ENCOUNTER — Encounter: Payer: Self-pay | Admitting: Neurology

## 2024-01-11 ENCOUNTER — Ambulatory Visit: Admitting: Nurse Practitioner

## 2024-01-16 NOTE — Discharge Instructions (Signed)

## 2024-01-17 ENCOUNTER — Ambulatory Visit
Admission: RE | Admit: 2024-01-17 | Discharge: 2024-01-17 | Disposition: A | Discharge status: Home / Self Care | Source: Ambulatory Visit | Attending: Neurology

## 2024-01-17 VITALS — BP 137/85 | HR 85

## 2024-01-17 DIAGNOSIS — M79601 Pain in right arm: Secondary | ICD-10-CM

## 2024-01-17 DIAGNOSIS — R202 Paresthesia of skin: Secondary | ICD-10-CM

## 2024-01-17 NOTE — Progress Notes (Signed)
1 vial of blood drawn from pts Left AC to be sent off with LP lab work. 1 successful attempt, pt tolerated well. Gauze and tape applied after.

## 2024-01-22 LAB — CSF CELL COUNT WITH DIFFERENTIAL
RBC Count, CSF: 6 {cells}/uL — ABNORMAL HIGH
TOTAL NUCLEATED CELL: 1 {cells}/uL (ref 0–5)

## 2024-01-22 LAB — CNS IGG SYNTHESIS RATE, CSF+BLOOD
Albumin Serum: 4.5 g/dL (ref 3.6–5.1)
Albumin, CSF: 17.6 mg/dL (ref 8.0–42.0)
CNS-IgG Synthesis Rate: -2.8 mg/(24.h) (ref <=3.3)
IgG (Immunoglobin G), Serum: 854 mg/dL (ref 600–1640)
IgG Total CSF: 1.6 mg/dL (ref 0.8–7.7)
IgG-Index: 0.48

## 2024-01-22 LAB — GLUCOSE, CSF: Glucose, CSF: 62 mg/dL (ref 40–80)

## 2024-01-22 LAB — OLIGOCLONAL BANDS, CSF + SERM: Oligo Bands: ABSENT

## 2024-01-22 LAB — PROTEIN, CSF: Total Protein, CSF: 31 mg/dL (ref 15–45)

## 2024-01-23 ENCOUNTER — Encounter: Payer: Self-pay | Admitting: Neurology

## 2024-05-20 ENCOUNTER — Other Ambulatory Visit: Payer: Self-pay | Admitting: Neurology

## 2024-05-20 DIAGNOSIS — R251 Tremor, unspecified: Secondary | ICD-10-CM

## 2024-05-20 DIAGNOSIS — G43009 Migraine without aura, not intractable, without status migrainosus: Secondary | ICD-10-CM

## 2024-07-02 NOTE — Progress Notes (Signed)
 NEUROLOGY FOLLOW UP OFFICE NOTE  Sheila Rodriguez [id number]  Subjective:  Sheila Rodriguez is a 48 y.o. year old right-handed female with a medical history of anxiety, depression, hx of Roux en Y surgery (~2014), migraines, GERD who we last saw on 01/06/24 for headaches and paresthesias of upper limbs.  To briefly review: 09/01/23: Around 2004, patient was [facility] by [facility] for migraines. She was on Topamax  and Flexeril. She thinks this worked. She got pregnant and stopped topamax  in 2007 and has not been on migraine medication since. She currently 2-3 headaches per week. She has pain behind her eyes. It can be one sided and cover her head. She used to have photophobia, phonophobia, and nausea, but not recently. She denies aura. She cannot take NSAIDs but takes tylenol  occasionally, but it does not work. Her headaches can last 6-8 hours or last 2 days. She endorses a long history of depression and anxiety. She sleeps around 8 hours. She does not think she snores. She feels well rested when she wakes. She denies neck pain.   Her arms started bothering her about 2 years ago. It started in her left upper arm with pain. She thought she pulled a muscle, but this pain continued. A few months later, her right arm started hurting. It felt like a muscle soreness. She has numbness and tingling in bilateral hands for a while (4th and 5th digits). The arm pain comes and goes, lasting a few weeks, then gone again. They can feel heavy and tired. She has seen a chiropractor in the past. She did 3 months without significant symptoms. MRI cervical spine showed no significant pathology. She has seen ortho who gave her gabapentin 100 mg BID, steroids, and referred to pain management. She did not find significant relief.   She has never had an EMG.   Patient has a family history of MS (brother). This is a concern for her. Her brother noticed a tremor while she was cutting cake, and thought this  could be MS.   Of note, patient is on bupropion , prozac for depression.   Caffeine use: 6 cups of diet green tea per day, occasional coffee EtOH use: none since 2017; previously heavier drinker Former smoker: quit over 20 years ago Family history of neurologic disease: MS in brother, dad has migraines  I started Topamax  25 mg daily and Sumatriptan  100 mg PRN on 09/01/23.  01/06/24: Labs were normal. EMG of bilateral upper extremities on 10/04/23 was normal. Patient mentioned she was seeing mild improvement of HA with topamax  and responding to Sumatriptan . She still had about 15 headaches per month at that time, so I increased the topamax  to 50 mg daily on 10/04/23. In 10/2023, she had about 12 HA days. I increased her Topamax  to 75 mg daily on 12/01/23. I also ordered MRI brain that did not show any significant abnormalities. There were a couple of white matter changes that can be seen in patients with migraines. She is still taking Topamax  75 mg daily.   In 11/2023 she had 10 headache days. In 12/2023 so far she has had 5 headaches. She is reporting tightness in her chest when taking Sumatriptan . It does not always get rid of her headache. She also has tried Rizatriptan in the past and it also caused chest pain.   She still has pain in her arms, but it comes and goes (left arm worse than right). Her arms sometimes feel heavy. She will notice discoloration of  her fingers, red, white, or blue, especially when cold.   Her tremor is about the same as prior.   She continues to take B12 supplementation.  Most recent Assessment and Plan (01/06/24): This is [employer], a 49 y.o. female with headaches, pain in arms, and tremor. 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 She was previously on Topamax  which worked well. She is currently on 75 mg daily and having ~10 headaches per month (down from ~12), so there is room for improvement. She is not tolerating sumatriptan  well as it is  causing chest pain. She is also not getting consistent relief with it, so does not always take it. She previously tried rizatriptan and also got chest pain with it. Triptans would be contraindicated going forward due to this.   In terms of tremor, it is very mild, but looks consistent with essential tremor. It is not currently bothersome enough for treatment, but topamax  can help at higher doses.   Regarding her arm pain, EMG was normal. The etiology is not clear. Patient is worried about MS, as her brother has it, but I don't have clear evidence for this in her. Her MRI showed only mild white matter changes which look more consistent with chronic migraine changes.   Plan: Migraine prevention:  Increase Topamax  to 50 mg BID Migraine rescue:  Stop sumatriptan  due to chest pain. Start Ubrelvy  100 mg as needed at headache onset Limit use of pain relievers to no more than 2 days out of week to prevent risk of rebound or medication-overuse headache. Keep headache diary  Since their last visit: Patient decided she wanted LP due to concern for MS. LP was normal (opening pressure, routine analysis, OCB, and IgG index).  In terms of headaches, she had 4 heads in July, 5 in August, 9 in August, and 4 so far in October. She continues to take topamax  50 mg BID. She is taking Ubrelvy  which works in about 1 hour. Overall, she likes her current regimen.   Her arms are somewhat better. She notices more symptoms when resting her arms on a desk. When she does not rest her arms on things, it does not seem to happen. So she avoids this trigger when able.  She is still taking B12, in a spray.  MEDICATIONS:  Outpatient Encounter Medications as of 07/13/2024  Medication Sig Note   buPROPion  (WELLBUTRIN  XL) 150 MG 24 hr tablet Take 150-300 mg by mouth 2 (two) times daily. 300 mg in the am and 150 mg in the evening 05/03/2015: Received from: External Pharmacy Received Sig:    FLUoxetine (PROZAC) 20 MG capsule Take 40  mg by mouth daily.     naltrexone (DEPADE) 50 MG tablet Take 50 mg by mouth daily.    topiramate  (TOPAMAX ) 25 MG tablet Take 2 tablets (50 mg total) by mouth 2 (two) times daily.    Ubrogepant  (UBRELVY ) 100 MG TABS Take 1 tablet (100 mg total) by mouth as needed (as need at headache onset).    No facility-administered encounter medications on file as of 07/13/2024.    PAST MEDICAL HISTORY: Past Medical History:  Diagnosis Date   Depression    GERD (gastroesophageal reflux disease)    Hyperlipidemia    TG - 178 (07/2012) per pt   Migraines    Morbid obesity (HCC)     PAST SURGICAL HISTORY: Past Surgical History:  Procedure Laterality Date   ESOPHAGOGASTRODUODENOSCOPY N/A 06/17/2015   Procedure: ESOPHAGOGASTRODUODENOSCOPY (EGD);  Surgeon: Camellia  Tanda, MD;  Location: [facility] ENDOSCOPY;  Service: General;  Laterality: N/A;   GASTRIC ROUX-EN-Y  10/16/2012   Procedure: LAPAROSCOPIC ROUX-EN-Y GASTRIC;  Surgeon: Camellia CHRISTELLA Tanda, MD,FACS;  Location: [facility] ORS;  Service: General;  Laterality: N/A;   gastric ulcer     repair   LAPAROSCOPIC CHOLECYSTECTOMY  09/27/1998   TUBAL LIGATION  07/28/2008   UPPER GI ENDOSCOPY  10/16/2012   Procedure: UPPER GI ENDOSCOPY;  Surgeon: Camellia CHRISTELLA Tanda, MD,FACS;  Location: [facility] ORS;  Service: General;;    ALLERGIES: Allergies  Allergen Reactions   Nsaids Other (See Comments)    Gets ulcers    Rizatriptan Other (See Comments)    Chest pain   Sumatriptan  Other (See Comments)    Chest pain    FAMILY HISTORY: Family History  Problem Relation Age of Onset   Breast cancer Mother 39   Cancer Mother        breast   Colon cancer Paternal Aunt    Cancer Paternal Aunt        colon   Bladder Cancer Paternal Aunt    Cancer Paternal Aunt        colon   Kidney cancer Maternal Uncle    Heart disease Father     SOCIAL HISTORY: Social History   Tobacco Use   Smoking status: Former    Current packs/day: 0.00    Types: Cigarettes    Quit date: 12/27/2003     Years since quitting: 20.5   Smokeless tobacco: Never  Vaping Use   Vaping status: Never Used  Substance Use Topics   Alcohol use: Not Currently    Comment: once a month   Drug use: No   Social History   Social History Narrative   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 Right   Are you currently employed ? 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 husband   What type of home do you live in: 1 story or 2 story? one    Caffeine one time a month soda      Objective:  Vital Signs:  BP 135/86   Pulse 83   Ht 5' 6 (1.676 m)   Wt 197 lb (89.4 kg)   SpO2 100%   BMI 31.80 kg/m   General: No acute distress.  Patient appears well-groomed.   Head:  Normocephalic/atraumatic Eyes:  fundi examined, disc margins clear, no obvious papilledema Neck: supple Lungs: Non-labored breathing on room air   Neurological Exam: Mental status: alert and oriented, speech fluent and not dysarthric, language intact.  Cranial nerves: CN I: not tested CN II: pupils equal, round and reactive to light, visual fields intact CN III, IV, VI:  full range of motion, no nystagmus, no ptosis CN V: facial sensation intact. CN VII: upper and lower face symmetric CN VIII: hearing intact CN IX, X: uvula midline CN XI: sternocleidomastoid and trapezius muscles intact CN XII: tongue midline  Bulk & Tone: normal, no fasciculations. Motor:  muscle strength 5/5 throughout Deep Tendon Reflexes:  2+ throughout.   Sensation:  Light touch sensation intact. Finger to nose testing:  Without dysmetria.    Gait:  Normal station and stride.  Romberg negative.   Labs and Imaging review: New results: Lumbar puncture (01/17/24): Opening  pressure: 20 cm of water Routine analysis: 6 RBC, 1 WBC, 31 protein, 62 glucose IgG index wnl OCB absent  Previously reviewed  results: 09/01/23: CK 38 Vit D wnl B6 wnl B1 wnl         Lab Results  Component Value Date    HGBA1C 5.4 05/19/2023      Recent Labs           Lab Results  Component Value Date    VITAMINB12 286 05/19/2023      Recent Labs[] Expand by Default           Lab Results  Component Value Date    TSH 1.87 05/19/2023      05/19/23: CMP unremarkable CBC unremarkable Lipid panel: Component     Latest Ref Rng 05/19/2023  Cholesterol     0 - 200 mg/dL 812   Triglycerides     0.0 - 149.0 mg/dL 08.9   HDL Cholesterol     >39.00 mg/dL 33.99   VLDL     0.0 - 40.0 mg/dL 81.7   LDL (calc)     0 - 99 mg/dL 896 (H)   Total CHOL/HDL Ratio 3   NonHDL 121.29     MRI cervical spine wo contrast (08/05/2022): FINDINGS: Alignment: Normal   Vertebrae: No fracture or focal bone lesion.   Cord: No cord compression or focal cord lesion.   Posterior Fossa, vertebral arteries, paraspinal tissues: Negative   Disc levels:   The foramen magnum is widely patent. There is ordinary mild osteoarthritis of the C1-2 articulation but no encroachment upon the neural structures.   C2-3: Normal   C3-4: Minimal disc bulge.  No stenosis.   C4-5: Normal interspace.   C5-6: Mild bulging of the disc. Slight indentation of the ventral subarachnoid space but no compressive effect upon the cord or nerve roots.   C6-7: Mild annular bulging. Slight indentation of the ventral subarachnoid space but no compressive effect upon the cord or nerve roots.   C7-T1: Normal interspace.   IMPRESSION: No likely significant finding. Mild non-compressive disc bulges at C3-4, C5-6 and C6-7.  EMG (10/04/23): NCV & EMG Findings: Extensive electrodiagnostic evaluation of bilateral upper limbs shows: Bilateral median, ulnar, radial, and medial-ulnar palmar sensory responses are within normal limits. Bilateral median (APB) and ulnar (ADM) motor responses are within normal limits. There is no evidence of active or  chronic motor axon loss changes affecting any of the tested muscles on needle examination. Motor unit configuration and recruitment pattern is within normal limits.   Impression: This is a normal study. Specifically: No electrodiagnostic evidence of a right or left cervical (C5-C8) motor radiculopathy. No electrodiagnostic evidence of a right or left median mononeuropathy at or distal to the wrist (ie: carpal tunnel syndrome). Screening studies for right or left ulnar or radial mononeuropathies are normal.   MRI brain w/wo contrast (12/22/23): FINDINGS: Brain:   No age-advanced or lobar predominant cerebral atrophy.   7 mm focus of T2 FLAIR hyperintense signal abnormality within the anterior left frontal lobe white matter (series 112, image 23). Several smaller foci of T2 FLAIR hyperintense signal abnormality scattered elsewhere within the bilateral cerebral white matter.   No cortical encephalomalacia is identified.   There is no acute infarct.   No evidence of an intracranial mass.   No chronic intracranial blood products.   No extra-axial fluid collection.   No midline shift.   No pathologic intracranial enhancement identified.   Vascular: Maintained flow voids within the  proximal large arterial vessels.   Skull and upper cervical spine: No focal worrisome marrow lesion.   Sinuses/Orbits: No mass or acute finding within the imaged orbits. No significant paranasal sinus disease.   IMPRESSION: 1.  No evidence of an acute intracranial abnormality. 2. Several foci of T2 FLAIR hyperintense signal abnormality scattered within the cerebral white matter, the largest within the anterior left frontal lobe. These signal changes are nonspecific and differential considerations include chronic small vessel ischemic disease, sequelae of chronic migraine headaches, sequelae of a demyelinating process and sequelae of a prior infectious/inflammatory process, among others. 3.  Otherwise unremarkable MRI appearance of the brain.  Assessment/Plan:  This is [employer]ard, a 48 y.o. female with: Episodic migraine without aura - currently having 4-5 headaches a month, up to 9 headaches in some months more rarely. She is happy with topamax  50 mg BID and not interested in making changes. Sumatriptan  caused chest pain, so she is now on Ubrelvy  which helps in about 1 hour. Tremors - mild. ?Essential tremor Arm pain - occurs when resting arms on desk. EMG normal, MRI brain not concerning for MS, LP normal.   Plan: Migraine prevention: Continue Topamax  to 50 mg BID Migraine rescue:  Continue Ubrelvy  100 mg as needed at headache onset Limit use of pain relievers to no more than 2 days out of week to prevent risk of rebound or medication-overuse headache. Keep headache diary  Return to clinic in 6 months  Total time spent reviewing records, interview, history/exam, documentation, and coordination of care on day of encounter:  30 min  Venetia Potters, MD

## 2024-07-13 ENCOUNTER — Encounter: Payer: Self-pay | Admitting: Neurology

## 2024-07-13 ENCOUNTER — Ambulatory Visit: Admitting: Neurology

## 2024-07-13 DIAGNOSIS — R251 Tremor, unspecified: Secondary | ICD-10-CM

## 2024-07-13 DIAGNOSIS — G43009 Migraine without aura, not intractable, without status migrainosus: Secondary | ICD-10-CM | POA: Diagnosis not present

## 2024-07-13 MED ORDER — TOPIRAMATE 50 MG PO TABS: 50.0000 mg | ORAL_TABLET | Freq: Two times a day (BID) | ORAL | 3 refills | Status: AC

## 2024-07-13 NOTE — Patient Instructions (Signed)
 We will not make any changes today.  Migraine prevention: Continue Topamax  to 50 mg twice daily Migraine rescue:  Continue Ubrelvy  100 mg as needed at headache onset Limit use of pain relievers to no more than 2 days out of week to prevent risk of rebound or medication-overuse headache. Keep headache diary  Return to clinic in 6 months  Please let me know if you have any questions or concerns in the meantime.  The physicians and staff at [facility] are committed to providing excellent care. You may receive a survey requesting feedback about your experience at our office. We strive to receive very good responses to the survey questions. If you feel that your experience would prevent you from giving the office a very good  response, please contact our office to try to remedy the situation. We may be reached at [phone]. Thank you for taking the time out of your busy day to complete the survey.  Venetia Potters, MD [facility]

## 2024-10-03 ENCOUNTER — Encounter: Payer: Self-pay | Admitting: Neurology

## 2024-10-04 ENCOUNTER — Telehealth: Payer: Self-pay | Admitting: Pharmacy Technician

## 2024-10-04 ENCOUNTER — Other Ambulatory Visit (HOSPITAL_COMMUNITY): Payer: Self-pay

## 2024-10-04 ENCOUNTER — Telehealth: Payer: Self-pay

## 2024-10-04 NOTE — Telephone Encounter (Signed)
 Pharmacy Patient Advocate Encounter   Received notification from Pt Calls Messages that prior authorization for UBRELVY  100MG  is required/requested.   Insurance verification completed.   The patient is insured through [facility].   Per test claim: PA required; PA submitted to above mentioned insurance via Latent Key/confirmation #/EOC [id number] Status is pending

## 2024-10-04 NOTE — Telephone Encounter (Signed)
 PA has been submitted, and telephone encounter has been created. Please see telephone encounter dated 1.8.26.

## 2024-10-09 ENCOUNTER — Ambulatory Visit: Admitting: Nurse Practitioner

## 2024-10-16 ENCOUNTER — Other Ambulatory Visit (HOSPITAL_COMMUNITY): Payer: Self-pay

## 2024-10-16 NOTE — Telephone Encounter (Signed)
 Pharmacy Patient Advocate Encounter  Received notification from [employer] that Prior Authorization for Ubrelvy  has been APPROVED from 10/04/2024 to 10/04/2025. Ran test claim, Copay is $0.00. This test claim was processed through [facility]- copay amounts may vary at other pharmacies due to pharmacy/plan contracts, or as the patient moves through the different stages of their insurance plan.

## 2024-11-30 ENCOUNTER — Encounter: Admitting: Nurse Practitioner

## 2025-01-11 ENCOUNTER — Ambulatory Visit: Admitting: Neurology
